# Patient Record
Sex: Female | Born: 1955 | Race: White | Marital: Single | Smoking: Never smoker
Health system: Northeastern US, Community
[De-identification: ages and names within clinical notes are randomized; demographics above are authoritative.]

## PROBLEM LIST (undated history)

## (undated) DIAGNOSIS — F32A Depression, unspecified: Secondary | ICD-10-CM

## (undated) DIAGNOSIS — I10 Essential (primary) hypertension: Secondary | ICD-10-CM

## (undated) DIAGNOSIS — E7800 Pure hypercholesterolemia, unspecified: Secondary | ICD-10-CM

## (undated) DIAGNOSIS — F329 Major depressive disorder, single episode, unspecified: Secondary | ICD-10-CM

## (undated) DIAGNOSIS — F419 Anxiety disorder, unspecified: Secondary | ICD-10-CM

## (undated) HISTORY — PX: ABDOMINAL HYSTERECTOMY: SHX81

## (undated) HISTORY — PX: BREAST SURGERY: SHX581

## (undated) HISTORY — PX: APPENDECTOMY: SHX54

## (undated) HISTORY — PX: KNEE CARTILAGE SURGERY: SHX688

## (undated) HISTORY — PX: RPR 1ST INGUN HRNA PRETERM INFT RDC: PRO125

## (undated) HISTORY — PX: VAGINAL HYSTERECTOMY UTERUS 250 GM/<: REP158

## (undated) HISTORY — PX: BUNIONECTOMY: SHX129

## (undated) HISTORY — DX: Depression, unspecified: F32.A

## (undated) HISTORY — DX: Major depressive disorder, single episode, unspecified: F32.9

## (undated) HISTORY — DX: Pure hypercholesterolemia, unspecified: E78.00

## (undated) HISTORY — PX: BREAST CYST EXCISION: SHX579

## (undated) HISTORY — DX: Essential (primary) hypertension: I10

## (undated) HISTORY — PX: BREAST BIOPSY: SHX20

## (undated) HISTORY — PX: APPENDECTOMY: GID334

## (undated) HISTORY — PX: BREAST MASS EXCISION: SHX1267

---
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
## 1998-03-26 HISTORY — PX: LASIK: SHX215

## 2017-10-22 LAB — PROTHROMBIN TIME CARE EVERYWHERE
INR CARE EVERYWHERE: 1.04 — NL (ref 0.84–1.18)
PROTHROMBIN TIME CARE EVERYWHERE: 11.8 s — NL (ref 9.5–13.4)

## 2017-10-22 LAB — APTT CARE EVERYWHERE: APTT CARE EVERYWHERE: 29.4 s — NL (ref 26.3–39.7)

## 2018-04-21 LAB — HEMOGLOBIN A1C CARE EVERYWHERE: HEMOGLOBIN A1C CARE EVERYWHERE: 5.4 % — NL (ref 4.8–5.6)

## 2019-08-27 ENCOUNTER — Other Ambulatory Visit: Payer: Self-pay

## 2019-08-28 DIAGNOSIS — S335XXA Sprain of ligaments of lumbar spine, initial encounter: Secondary | ICD-10-CM | POA: Diagnosis not present

## 2019-08-28 DIAGNOSIS — M9903 Segmental and somatic dysfunction of lumbar region: Secondary | ICD-10-CM | POA: Diagnosis not present

## 2019-08-28 DIAGNOSIS — M9901 Segmental and somatic dysfunction of cervical region: Secondary | ICD-10-CM | POA: Diagnosis not present

## 2019-08-28 DIAGNOSIS — S134XXA Sprain of ligaments of cervical spine, initial encounter: Secondary | ICD-10-CM | POA: Diagnosis not present

## 2019-10-05 DIAGNOSIS — M25561 Pain in right knee: Secondary | ICD-10-CM | POA: Diagnosis not present

## 2019-11-24 ENCOUNTER — Encounter (HOSPITAL_BASED_OUTPATIENT_CLINIC_OR_DEPARTMENT_OTHER): Payer: Self-pay | Admitting: Internal Medicine

## 2019-11-24 ENCOUNTER — Ambulatory Visit: Payer: 59 | Attending: Internal Medicine | Admitting: Internal Medicine

## 2019-11-24 DIAGNOSIS — F330 Major depressive disorder, recurrent, mild: Secondary | ICD-10-CM | POA: Insufficient documentation

## 2019-11-24 DIAGNOSIS — F419 Anxiety disorder, unspecified: Secondary | ICD-10-CM | POA: Insufficient documentation

## 2019-11-24 DIAGNOSIS — L608 Other nail disorders: Secondary | ICD-10-CM | POA: Insufficient documentation

## 2019-11-24 DIAGNOSIS — F411 Generalized anxiety disorder: Secondary | ICD-10-CM | POA: Insufficient documentation

## 2019-11-24 DIAGNOSIS — G4700 Insomnia, unspecified: Secondary | ICD-10-CM | POA: Insufficient documentation

## 2019-11-24 DIAGNOSIS — R0683 Snoring: Secondary | ICD-10-CM | POA: Insufficient documentation

## 2019-11-24 DIAGNOSIS — R030 Elevated blood-pressure reading, without diagnosis of hypertension: Secondary | ICD-10-CM

## 2019-11-24 MED ORDER — ESCITALOPRAM OXALATE 10 MG PO TABS
10.0000 mg | ORAL_TABLET | Freq: Every day | ORAL | 0 refills | Status: DC
Start: 2019-11-24 — End: 2019-12-18
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
## 2019-11-27 ENCOUNTER — Telehealth (HOSPITAL_BASED_OUTPATIENT_CLINIC_OR_DEPARTMENT_OTHER): Payer: Self-pay

## 2019-11-27 NOTE — Telephone Encounter (Signed)
[facility] to[facility] an appointmentfor [facility] exam. No interpreter needed.   Patient (self) answered at [phone]; appointment booked. Patient notified by telephone.    Denyce Robert, [place], 11/27/2019    On behalf of EOF:HQRFX Vassie Loll, MD

## 2019-12-03 ENCOUNTER — Telehealth (HOSPITAL_BASED_OUTPATIENT_CLINIC_OR_DEPARTMENT_OTHER): Payer: Self-pay | Admitting: Mental Health

## 2019-12-03 ENCOUNTER — Encounter (HOSPITAL_BASED_OUTPATIENT_CLINIC_OR_DEPARTMENT_OTHER): Payer: Self-pay | Admitting: Mental Health

## 2019-12-03 NOTE — Telephone Encounter (Signed)
Care Partner Note    Type of encounter:  Initial    Patient was referred to care partner through:  Warm hand-off from PCP or PA Dr. Cheron Every    Reason for encounter: MDD    Assessment:   Pt reached  Pt was doing well  Pt reported that has been having trouble finding therapist    This Clinical research associate:  Offered empathetic listening  Provided resources( a list of 4 providers that takes her insurance)  Sent through [employer] information on how to find a therapist    Denise Gutierrez, [facility], 12/03/19    Appears to be a Step 1 patient on the Stepped Model of Care    Patient Active Problem List:     Elevated BP without diagnosis of hypertension     Snoring     Toenail deformity     Insomnia     Major depressive disorder, recurrent, mild (HCC)     Anxiety disorder      Medication: escitalopram (LEXAPRO) 10 MG tablet, Take 1 tablet by mouth daily, Disp: 30 tablet, Rfl: 0    No current facility-administered medications on file prior to visit.      Relevant screening scores:   AWQ:   PHQ-9:   PHQ-9 TOTAL SCORE 11/24/2019   Doc FlowSheet Total Score 8     GAD-7:   GAD-7 Total 11/24/2019   GAD-7 Score 13      Child/Adolescent:    How difficult have these problems made it for you to do your work, take care of things at home, or get along with people?  Somewhat difficult     Engagement/motivation for change  (http://www.taylor-bailey.[email or web address] )  Contemplation    Primary brief Intervention provided during this encounter:  Provided Resources    PLAN:   Will connect with therapist  Check in iin 2 weeks    TIME SPENT: 10-20 minutes     Denise Gutierrez [facility] [place], 12/03/2019

## 2019-12-18 ENCOUNTER — Other Ambulatory Visit: Payer: Self-pay

## 2019-12-18 ENCOUNTER — Ambulatory Visit: Payer: 59 | Attending: Podiatrist | Admitting: Podiatrist

## 2019-12-18 ENCOUNTER — Ambulatory Visit
Admission: RE | Admit: 2019-12-18 | Discharge: 2019-12-18 | Disposition: A | Discharge status: Home / Self Care | Payer: 59 | Attending: Internal Medicine | Admitting: Internal Medicine

## 2019-12-18 ENCOUNTER — Encounter (HOSPITAL_BASED_OUTPATIENT_CLINIC_OR_DEPARTMENT_OTHER): Payer: Self-pay | Admitting: Internal Medicine

## 2019-12-18 VITALS — BP 160/82 | HR 75 | Temp 97.8°F

## 2019-12-18 DIAGNOSIS — B351 Tinea unguium: Secondary | ICD-10-CM | POA: Insufficient documentation

## 2019-12-18 DIAGNOSIS — Z5181 Encounter for therapeutic drug level monitoring: Secondary | ICD-10-CM | POA: Insufficient documentation

## 2019-12-18 DIAGNOSIS — F419 Anxiety disorder, unspecified: Secondary | ICD-10-CM

## 2019-12-18 DIAGNOSIS — F330 Major depressive disorder, recurrent, mild: Secondary | ICD-10-CM

## 2019-12-18 LAB — HEPATIC FUNCTION PANEL
ALANINE AMINOTRANSFERASE: 24 U/L (ref 12–45)
ALBUMIN: 3.9 g/dL (ref 3.4–5.0)
ALKALINE PHOSPHATASE: 154 U/L — ABNORMAL HIGH (ref 45–117)
ASPARTATE AMINOTRANSFERASE: 16 U/L (ref 8–34)
BILIRUBIN DIRECT: 0.1 mg/dl (ref 0.0–0.2)
BILIRUBIN TOTAL: 0.5 mg/dL (ref 0.2–1.0)
INDIRECT BILIRUBIN: 0.4 mg/dL (ref 0.2–0.9)
TOTAL PROTEIN: 7.3 g/dL (ref 6.4–8.2)

## 2019-12-18 MED ORDER — TERBINAFINE HCL 250 MG PO TABS
250.00 mg | ORAL_TABLET | Freq: Every day | ORAL | 2 refills | Status: AC
Start: 2019-12-18 — End: 2020-03-17

## 2019-12-18 NOTE — Procedures (Deleted)
Valentina Shaggy, MD - 10/05/2014  Formatting of this note might be different from the original.       [facility]   Adult Echocardiography Laboratory   [address] [place], [place] [address]   Tel: [phone] Fax: [phone]    2D Transthoracic Echo Report      --------------------------------------------------------------------------------  Pt. Name:   Denise Gutierrez     Study Date:     10/05/2014  Pt. ID:     [id number]        Accession #:    [id number]  DOB:        Dec 18, 1955      Ref. Physician: [id number] Glorianne Manchester  Age/Gender: 25 F           Sonographer:    MR  Height:     175 cm (69 in) Fellow:  Weight:     77 kg (169 lb) BSA:            1.92 m  Location:   [facility]      EPIC Code:      ECH10-E      --------------------------------------------------------------------------------  Indication: CP INPT      ---------------------------------------------- ----------------------------------  MEASUREMENTS:            Value    Normal                          Value      Normal  Aortic    3.5 cm   <4.1 cm            LVOT  Root                                  Diameter  LA        4.4 cm   <3.8 (<4.0)        LVOT Area  Diameter                              LVOT Stroke  LA Vol    32 ml/m <34                Volume  Index                                 LVOT Vmax    1.0 m/s  ---------                              (rest)                                        LVOT                                        dPmax(rest)  IV Septum 1.1 cm   <1.1 (<1.2)        LVOT Vmax  LVEDD     4.0 cm   <5.3 (<6.0)        (Vals)  Inf-Lat   1.1 cm   <1.1 (<1.2)  LVOT dPmax  Wall                                  (Vals)  LVESD     2.7 cm                      ------------  ---------                                          AV Vmax      1.2 m/s    1. 0-1.7  LVED Vol  44 ml/m <75                AV Peak  Index                                 Gradient  LV Mass   76 g/m  <95 (<115)         AV Mean      3 mmHg  Index                                  Gradient  ---------                             AV Area                 2-4                                        Impedance               <3.5                                        (Zva)  LVEF      65 %     50-70%             Aortic  ----- ----                             Regurge P                                        1/2                                        ------------  RAP, mean          0-5  PASP               <35  PADP               <15                MV E wave    0.7  m/s    0.6-1.3  RV-RA     19 mmHg                     Vmax  PA-RV                                 MV Mean  LA, mean           <12                Gradient  ---------                             MV Ar ea                 4-6                                        ------------    Mitral E  75 cm/s  Mitral A  86 cm/s                     TV E wave               0.3-0.7  Mitral    0.9                         Vmax  E/A                                   TV Mean  Decel     160 msec                    Gradient  Time                                  ------------  Mitral P  46 msec  1/2  E'        11 cm/s  >8                 RVOT  (medial)                               Diameter  E'        10 cm/s  >8                 RVOT Area  (lateral)                             RVOT Stroke  E/E'      7.1      <8                 Volume  PV S/D    >1                          RVOT Vmax  Normal values in parentheses are      ------------  specific for men; normal aortic root  values adjusted for age and BSA.  LAVI  data reflect 2015 ASE guidelines.     PV Vmax                 0.6-0.9                                         PV Peak                                        Gradient                                        ------------                                          BP           160/94                                                     mmHg                                        HR           60  bpm      --------------------------------------------------------------------------------  TECHNIQUE:  Complete 2D transthoracic ec hocardiogram with color and spectral Doppler was performed. Study   quality was good.      --------------------------------------------------------------------------------  FINDINGS:  Left Heart:  --There is no left atrial dilatation (LA volume index 32 ml/m).  --The left ventricle has normal end-diastolic diameter.  --LV analysis by 2D Simpson's method: LV end-diastolic volume index is normal (44 ml/m; normal   is less or equal to 75  mL/m2). LV end-systolic volume index is normal (14 ml/m; normal is   less or equal to 30 mL/m2).    --There is concentric remodeling of the left ventricle.  --Global LV wall motion is normal. LV ejection fraction is normal.  --The left ventricular filling pattern is consistent with abnormal relaxation. This may be   normal for age.  Mitral Valve:  --There is mild mitral regurgitation.  Aortic Valve:  --Aortic valve is trileaflet. Ther e is no aortic regurgitation.    Aorta:  --The aortic root is normal in size. Aortic arch is normal in size; its diameter is 2.5 cm.    Right Heart and Systemic Veins:  --There is no right atrial dilatation.  --The right ventricle is normal in size. Tricuspid annular plane systolic excursion (TAPSE), a   marker of RV systolic function, is normal (2.5 cm; normal greater than 1.5 cm). Peak tricuspid   valve annular tissue Doppler S wave v elocity is normal (14 cm/s; normal is equal or greater   than 10 cm/s). The right ventricle has normal wall motion.  Tricuspid Valve:  --There is trace tricuspid regurgitation.  Pericardium and Effusions: --There is no pericardial effusion.    --------------------------------------------------------------------------------  CONCLUSION:  --There is no left atrial dilatation (LA volume index 32 ml/m).  --The left ventricle has normal end -diastolic diameter.  --There is concentric  remodeling of the left ventricle.  --Global LV wall motion is normal.  --LV ejection fraction is normal.  --The left ventricular filling pattern is consistent with abnormal relaxation. This may be   normal for age.  --There is no right atrial dilatation. The right ventricle is normal in size. The right   ventricle has normal wall motion.  --The aortic root is normal in size (3.5 cm). Aortic ar ch is normal in size; its diameter is   2.5 cm.  --Aortic valve is trileaflet. There is no aortic regurgitation.  --There is mild mitral regurgitation.  --There is trace tricuspid regurgitation.  --There is no pericardial effusion.  --No prior study available for comparison.    Electronically released/read by: Valentina Shaggy, MD on 10/05/2014 at 11:00:57 AM         * Final *   IMPRESSION

## 2019-12-18 NOTE — Procedures (Deleted)
[name], MD - 12/22/2018  Formatting of this note might be different from the original.  Magnetic resonance imaging of the left forefoot 12/21/2018    Magnetic resonance imaging of the left forefoot was performed using axial inversion recovery, axial sagittal and coronal fast spin-echo sequences in addition to metal artifact reduction [facility]) inversion recovery sequence.    HISTORY: Patient fell 2 months ago. Pain.    Correlat ion is made with radiographs 12/09/2018    Susceptibility artifact is generated by instrumentation transfixing Lapidus arthrodesis and 1st proximal phalangeal osteotomy.    Of note, patient has sustained a, fairly acute, obliquely oriented fracture of the distal diaphysis of the 3rd metatarsal with overriding and bayoneting of the fracture fragments. There is moderate reactive edema throughout the shaft of the metatarsal with surroundin g soft tissue periostitis and intramuscular hyperintensity. There is resultant foreshortening of the 3rd ray.    No other fractures are identified    Signal and morphology of the hallux sesamoids is within normal.    There is intrinsic muscle atrophy    Patient has apparently undergone medial eminence resection. There are fairly moderate arthritic changes in the 1st MTP and sesamotrochlear articulations with chronic fragmentation of the  hallux sesamoids.     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    IMPRESSION:    Magnetic resonance imaging of the left forefoot demonstrates acute mid shaft 3rd metatarsal fracture with bayoneting of the fracture fragments and resultant foreshortening of the 3rd ray with reactive edema, surrounding soft tissue periostitis and intramuscular hyperintensity    No other acute osseous abnormalities ar e seen    Postoperative changes are present in the hallux in satisfactory alignment.    Report faxed to Dr. Henriette Combs office 9:00 AM 12/22/2018    Final Report: Dictated by  and  Personally Viewed and Signed by Arther Dames MD. Released Date and Time: 12/22/2018 8:03 AM.

## 2019-12-18 NOTE — Procedures (Deleted)
Bernarda Caffey, MD - 10/04/2014  Formatting of this note might be different from the original.  Chest PA and lateral    History: Short of breath    Technique: PA and Lateral views of the chest were obtained.    Comparison: None    Findings:     Heart size within normal limits. Tortuous aorta.    Airways inflammation. No consolidation, edema, significant pleural fluid or discernible pneumothorax.    Mild degenerative changes of the spine . No acute osseous finding.    Impression:    No radiographic evidence for active cardiopulmonary disease.    Final Report: Dictated by   and Signed by Attending Cristino Martes MD 10/04/2014 7:45 PM

## 2019-12-18 NOTE — Procedures (Deleted)
Johnston Ebbs, MD - 10/05/2014  Formatting of this note might be different from the original.  [facility]      [address] [place], [place] [address]         TEL:[phone]     Lower Extremity Venous Duplex      Pt. Name: Denise Gutierrez     Exam Date:   10/05/2014  Pt. ID:   [id number]       Pt Location  [facility]  DOB:      August 26, 1955      Sonographer: Carlynn Purl  Sex:      F Age:64 years Diagnosis    Deep Vein Th romb of lower extrem    Referring Physician: [id number] Lanice Shirts SHEPPARD    Symptoms:  History:  SOB     TECHNICAL IMAGING & DOPPLER FINDINGS    Right Lower Extremity:    There is no evidence of acute deep vein thrombosis or deep reflux in the right lower extremity.   All major veins are patent and compressible. The right posterior tibial and peroneal veins are   normal.      Left Lower Extremity:    There is no evidence of acute deep  vein thrombosis or deep reflux in the left lower extremity.   All major veins are patent and compressible. The left posterior tibial and peroneal veins are   normal.      Electronically Signed by: [id number] Gasper Lloyd MD 10/05/2014 1:54:37 PM  Signature on file         * Final *   IMPRESSION

## 2019-12-18 NOTE — Procedures (Deleted)
Denise Noss, MD - 11/26/2017  Formatting of this note might be different from the original.  AP lateral oblique left foot 11/26/2017    HISTORY: Postop    Comparison is made to prior study 09/04/2017    Patient is undergone interval first TMT arthrodesis with first proximal phalangeal osteotomy. Alignment of the hallux is satisfactory. Negative for fracture. There is mild soft tissue swelling. There are enthesopathic changes at the  Achilles insertion as well as at the base of the fifth metatarsal    IMPRESSION:  Postoperative changes with overall improved alignment of the hallux.

## 2019-12-18 NOTE — Procedures (Deleted)
Lanelle Bal, MD - 12/11/2018  Formatting of this note might be different from the original.  EXAM: XR FOOT AND ANKLE LEFT AP, LATERAL, OBLIQUE    COMPARISON: 04/29/2018    INDICATION: Postoperative    IMPRESSION:    Again, the patient is status post Lapidus arthrodesis with medial eminence resection and first proximal phalangeal osteotomy. No interval complication and the alignment is unchanged. Bone graft harvest site is redemonst rated at the calcaneus.     Calcaneal spurring is redemonstrated and there are enthesopathic changes at the base of the fifth metatarsal, unchanged.    Ankle mortise is congruent with preservation of tibiotalar joint space and contour of the talar dome.    Final Report: Dictated by  and Personally Viewed and Signed by Lethea Killings MD. Released Date and Time: 12/11/2018 6:03 PM.

## 2019-12-18 NOTE — Procedures (Deleted)
[name], MD - 04/29/2018  Formatting of this note might be different from the original.  AP lateral oblique left foot 04/29/2018    HISTORY: Postop    Comparison made to prior study 01/22/2018    Patient is status post Lapidus arthrodesis with medial eminence resection and 1st proximal phalangeal osteotomy. Alignment of the hallux is unchanged. Negative for fracture. There is soft tissue swelling. Bone graft has apparently been  harvested from the calcaneus. There are chronic enthesopathic changes at the base of the 5th metatarsal    IMPRESSION:  Postoperative changes without acute osseous abnormality or change in alignment    Final Report: Dictated by  and Personally Viewed and Signed by Dr. Arther Dames. Released Date and Time: 04/29/2018 4:44 PM.

## 2019-12-18 NOTE — Procedures (Deleted)
Nydia Bouton, MD - 10/05/2014  Formatting of this note might be different from the original.  [facility]           Nuclear Cardiology Lab          [address], [address]             [place], [place] [address]   TEL: [phone] FAX: [phone]      --------------------------------------------------------------------------------  Treadmill Stress Test      -------------------------------------------------------------- ------------------  Patient Name: Denise Gutierrez Study Date: 10/05/2014  DOB:          1956/03/20  Weight:     168 lb  MR#:          [id number]   Height:     69 in  Accession:    [id number]   BMI:        24.81 kg/m  Gender:       F          Epic Code:  [id number]  Age:          63 years   Location:   [facility] 2nd Floor      Ref Physician: Yevette Edwards.                        Interviewed by: Jackelyn Hoehn  Supervising:   Nydia Bouton                          Performed by:   Jackelyn Hoehn  Fellow:        Dimple Casey and Temple Pacini,                 NP      --------------------------------------------------------------------------------  Indication: Cardiovascular Disease, Unspecified    --------------------------------------------------------------------------------  Clinical History:  Reason For Test:     Evaluation of chest pain.  Risk Factors:        Post menopausal.  Pre-te st Chest Pain: Atypical Angina.  Cardiac History:     none  Known CAD:           No  Other History:       None.  Medications:         none  Allergies:           IV contrast.      ==============================================================================  IMPRESSION: Normal study.    SUMMARY:   1. No ECG evidence of ischemia during maximal exercise stress test. No chest pain on exertion.   Exercise tolerance above average for age and  gender.    --------------------------------------------------------------------------------  Electrocardiographic and Hemodynamic  Data      --------------------------------------------------------------------------------  Baseline ECG: NSR at 69.    Stress ECG Response: No ECG abnormalities for ischemia.    Ectopic beats: Occasional premature ventricular contractions.      The patient performed treadmill exercise under the [facility], completing 9 minutes 11   seconds, and achieved an estimated workload of 10 metabolic equivalents (METS). Good level of   exercise.    Bruce Protocol:  +--------+----------+--------+-------+------+--------+  Time    Speed(mph)Grade(%)HR(bpm)  BP  O2 Sat %  +--------+----------+--------+-------+------+--------+  Baseline Standing   N/A     69   142/92  100     +--------+----------+--------+-------+------+--------+  3:00       1.7       10%     106  148/98          +--------+----------+--------+-------+------+--------+  6:00       2.5      12%     133  162/92          +--------+----------+--------+-------+------+--------+  9:11       3.4      14%     157  172/90   99     +--------+----------+--------+-------+------+--------+  Recovery:  +----+--------++---+-------+---+  1:00Recovery127178/101100  +----+--------++---+-------+---+  2:00Recovery100            +----+---- ----++---+-------+---+  3:00Recovery100155/91      +----+--------++---+-------+---+  4:00Recovery91             +----+--------++---+-------+---+  5:00Recovery85 137/89      +----+--------++---+-------+---+      Exercise Stress Test Data:  Bruce Protocol                85% of Max Predicted HR: 137 bpm  Baseline HR: 69 bpm           Peak HR: 157 bpm  HR Recovery: 30 bpm           HR Reserve: 92 bpm  Baseline BP: 142/92 mmHg      Peak BP:  178/101 mmHg  Baseline RPP: [id number]            Peak RPP: [id number]  Exercise Duration: 11sec  Exercise Terminated Due To:   Fatigue.      Symptoms / Exercise Impression:  Symptoms:    No chest pain.  HR Response: Calculated patient's HR reserve indicates a normal chronotropic               response (normal is greater  than 80%). This is an appropriate               response.  BP Response: Resting hypertension.  Performance: Good exercise c apacity for age and gender.      Electronically Signed by: Nydia Bouton, MD., on 10/05/2014 at 5:08:33 PM         * Final *   IMPRESSION
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
## 2019-12-18 NOTE — Procedures (Deleted)
[name], MD - 01/23/2018  Formatting of this note might be different from the original.  AP lateral oblique left foot 01/22/2018    HISTORY: Follow-up foot surgery    Comparison is made to prior study 12/25/2017    Instrumentation is present transfixing the first TMT joint. Patient has also undergone bunionectomy and first proximal phalangeal osteotomy. Alignment and appearance of the hardware is unchanged. Negative for fractur e    IMPRESSION:  Postoperative changes status post hallux valgus correction without acute osseous abnormality or change in alignment

## 2019-12-18 NOTE — Procedures (Deleted)
Madison Hickman, MD - 10/05/2014  Formatting of this note might be different from the original.  NM LUNG SCAN PERFUSION VENTILATION    Agent:    4.0 mCi 52mTc-MAA, right antecubital IV.  42.0 mCi 38mTc-DTPA, aerosolized.    History:    64 year old woman with acute chest pain concerning for acute PE.  This study was performed to look for evidence of pulmonary embolism.  Slightly elevated D dimer.    Comparison:    CXR 10/04/2014.    Tech nique:    35mTc-DTPA aerosol was administered per protocol and planar images of the lungs were acquired in the usual 8 anterior, lateral and oblique orientations.    104mTc-MAA was then administered and planar perfusion images of the lungs were acquired in the same 8 orientations.    Findings:    There are no significant segmental or lobar mismatched perfusion defects identified.  Slightly patchy matched decreased ventilation and perfusi on is noted in the anterior LUL; this could represent mild parenchymal lung disease or perhaps attenuation artifact.    Impression:    1.  Very low probability for pulmonary embolism.    Final Report: Dictated by Resident Sherrin Daisy MD  and Signed by Attending Lurlean Leyden MD 10/05/2014 2:54 PM

## 2019-12-18 NOTE — Procedures (Deleted)
[name], MD - 12/30/2017  Formatting of this note might be different from the original.  AP lateral oblique left foot 12/25/2017    HISTORY: Postop    Comparison made to prior study 11/26/2017    Patient is status post Lapidus arthrodesis with bunionectomy and first proximal phalangeal osteotomy. Alignment and appearance of the hardware is grossly unchanged. Negative for fracture. There are chronic enthesopathic changes at the  base of the fifth metatarsal.    IMPRESSION:  Postoperative changes without acute osseous abnormality or change in alignment.

## 2019-12-18 NOTE — Procedures (Deleted)
[name], MD - 09/04/2017  Formatting of this note might be different from the original.  AP lateral oblique bilateral feet 09/04/2017    HISTORY: Bilateral foot pain    On the right, there is hallux valgus with bunion formation. There are hammertoe deformities. Negative for fracture. There is ossification at the Achilles insertion    On the left, patient is apparently status post bunionectomy and questionably Lapidus arthrodesis . There are chronic changes centered about the first TMT joint with partial, though incomplete osseous bridging. There are hammertoe deformities. There is mild arthrosis at the first MTP joint. There are chronic enthesopathic changes at the base the fifth metatarsal    IMPRESSION:    Bunion formation on the right with postoperative changes on the left with apparent incomplete osseous bridging across attempted Lapidus arthrodesis without  acute osseous abnormality.

## 2019-12-18 NOTE — Procedures (Deleted)
Systemgenerated, Documentation - 11/07/2017  Formatting of this note might be different from the original.  These images were obtained during an operative procedure and are included in [facility] information systems for archiving purposes only. Information and documentation of the procedure(s) performed should be included in the operative report. Radiology provided the technical component only

## 2019-12-19 NOTE — Telephone Encounter (Signed)
PER Pharmacy, Arieonna Medine is a 64 year old female has requested a refill of    LEXAPRO       Last Office Visit: 11/24/2019   With [facility], J   Last Physical Exam:N/A    PAP SMEAR Never done  HPV SCREENING Never done  FECAL OCCULT BLOOD AGE 11+ Never done    Other Med Adult:  Most Recent BP Reading(s)  12/18/19 : 160/82        No results found for: CHOLESTEROL  No results found for: LDL  No results found for: HDL  No results found for: TG      No results found for: TSHSC      No results found for: TSH    No results found for: HGBA1C    No results found for: POCA1C      INR CARE EVERYWHERE (no units)   Date Value   10/22/2017 1.04       No results found for: NA    No results found for: K        No results found for: CREAT    Documented patient preferred pharmacies:    [id number] [address], [place] - [address]  Phone: [phone] Fax: [phone]

## 2019-12-20 MED ORDER — ESCITALOPRAM OXALATE 10 MG PO TABS
10.0000 mg | ORAL_TABLET | Freq: Every day | ORAL | 0 refills | Status: DC
Start: 2019-12-20 — End: 2020-03-29

## 2019-12-21 ENCOUNTER — Telehealth (HOSPITAL_BASED_OUTPATIENT_CLINIC_OR_DEPARTMENT_OTHER): Payer: Self-pay | Admitting: Podiatrist

## 2019-12-21 NOTE — Telephone Encounter (Signed)
Spoke with the patient and reviewed her lab results. Alk phos is elevated. She has no history of gall stones or indigestion issues- will defer to pcp  Whether further work up warranted. For her terbinafine explained we will do pulse dosing - take daily for one week every month.

## 2020-01-05 ENCOUNTER — Encounter (HOSPITAL_BASED_OUTPATIENT_CLINIC_OR_DEPARTMENT_OTHER): Payer: Self-pay | Admitting: Internal Medicine

## 2020-01-05 ENCOUNTER — Ambulatory Visit: Payer: 59 | Attending: Internal Medicine | Admitting: Internal Medicine

## 2020-01-05 ENCOUNTER — Telehealth (HOSPITAL_BASED_OUTPATIENT_CLINIC_OR_DEPARTMENT_OTHER): Payer: Self-pay | Admitting: Internal Medicine

## 2020-01-05 ENCOUNTER — Other Ambulatory Visit: Payer: Self-pay

## 2020-01-05 VITALS — BP 130/84 | HR 73 | Ht 67.5 in | Wt 166.0 lb

## 2020-01-05 DIAGNOSIS — R030 Elevated blood-pressure reading, without diagnosis of hypertension: Secondary | ICD-10-CM | POA: Diagnosis present

## 2020-01-05 DIAGNOSIS — G4700 Insomnia, unspecified: Secondary | ICD-10-CM | POA: Diagnosis not present

## 2020-01-05 DIAGNOSIS — R0683 Snoring: Secondary | ICD-10-CM | POA: Diagnosis not present

## 2020-01-05 DIAGNOSIS — Z0000 Encounter for general adult medical examination without abnormal findings: Secondary | ICD-10-CM | POA: Diagnosis present

## 2020-01-05 DIAGNOSIS — Z23 Encounter for immunization: Secondary | ICD-10-CM | POA: Diagnosis not present

## 2020-01-05 DIAGNOSIS — Z7189 Other specified counseling: Secondary | ICD-10-CM | POA: Insufficient documentation

## 2020-01-05 DIAGNOSIS — Z1231 Encounter for screening mammogram for malignant neoplasm of breast: Secondary | ICD-10-CM | POA: Diagnosis present

## 2020-01-05 DIAGNOSIS — F419 Anxiety disorder, unspecified: Secondary | ICD-10-CM | POA: Diagnosis not present

## 2020-01-05 DIAGNOSIS — E7849 Other hyperlipidemia: Secondary | ICD-10-CM | POA: Insufficient documentation

## 2020-01-05 DIAGNOSIS — E785 Hyperlipidemia, unspecified: Secondary | ICD-10-CM | POA: Diagnosis present

## 2020-01-05 DIAGNOSIS — F330 Major depressive disorder, recurrent, mild: Secondary | ICD-10-CM | POA: Diagnosis present

## 2020-01-05 LAB — CBC, PLATELET & DIFFERENTIAL
ABSOLUTE BASO COUNT: 0.1 10*3/uL (ref 0.0–0.1)
ABSOLUTE EOSINOPHIL COUNT: 0.2 10*3/uL (ref 0.0–0.8)
ABSOLUTE IMM GRAN COUNT: 0.01 10*3/uL (ref 0.00–0.03)
ABSOLUTE LYMPH COUNT: 1.9 10*3/uL (ref 0.6–5.9)
ABSOLUTE MONO COUNT: 0.4 10*3/uL (ref 0.2–1.4)
ABSOLUTE NEUTROPHIL COUNT: 4.1 10*3/uL (ref 1.6–8.3)
ABSOLUTE NRBC COUNT: 0 10*3/uL (ref 0.0–0.0)
BASOPHIL %: 1.2 % (ref 0.0–1.2)
EOSINOPHIL %: 2.5 % (ref 0.0–7.0)
HEMATOCRIT: 45.6 % — ABNORMAL HIGH (ref 34.1–44.9)
HEMOGLOBIN: 14.5 g/dL (ref 11.2–15.7)
IMMATURE GRANULOCYTE %: 0.1 % (ref 0.0–0.4)
LYMPHOCYTE %: 28.2 % (ref 15.0–54.0)
MEAN CORP HGB CONC: 31.8 g/dL (ref 31.0–37.0)
MEAN CORPUSCULAR HGB: 27.6 pg (ref 26.0–34.0)
MEAN CORPUSCULAR VOL: 86.7 fl (ref 80.0–100.0)
MEAN PLATELET VOLUME: 11.5 fL (ref 8.7–12.5)
MONOCYTE %: 6.6 % (ref 4.0–13.0)
NEUTROPHIL %: 61.4 % (ref 40.0–75.0)
NRBC %: 0 % (ref 0.0–0.0)
PLATELET COUNT: 315 10*3/uL (ref 150–400)
RBC DISTRIBUTION WIDTH STD DEV: 44.6 fL (ref 35.1–46.3)
RED BLOOD CELL COUNT: 5.26 M/uL — ABNORMAL HIGH (ref 3.90–5.20)
WHITE BLOOD CELL COUNT: 6.7 10*3/uL (ref 4.0–11.0)
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
## 2020-01-05 NOTE — Telephone Encounter (Signed)
tadp    The vaccine is covered under the patient's [employer] medical coverage.    Please choose Private

## 2020-01-06 LAB — COMP METABOLIC PANEL, FASTING
ALANINE AMINOTRANSFERASE: 26 U/L (ref 12–45)
ALBUMIN: 3.8 g/dL (ref 3.4–5.0)
ALKALINE PHOSPHATASE: 144 U/L — ABNORMAL HIGH (ref 45–117)
ANION GAP: 4 mmol/L — ABNORMAL LOW (ref 5–15)
ASPARTATE AMINOTRANSFERASE: 16 U/L (ref 8–34)
BILIRUBIN TOTAL: 0.4 mg/dL (ref 0.2–1.0)
BUN (UREA NITROGEN): 22 mg/dL — ABNORMAL HIGH (ref 7–18)
CALCIUM: 9.2 mg/dL (ref 8.5–10.1)
CARBON DIOXIDE: 28 mmol/L (ref 21–32)
CHLORIDE: 108 mmol/L — ABNORMAL HIGH (ref 98–107)
CREATININE: 0.7 mg/dL (ref 0.4–1.2)
ESTIMATED GLOMERULAR FILT RATE: >60 mL/min (ref >60)
GLUCOSE FASTING: 80 mg/dL (ref 74–106)
POTASSIUM: 4.3 mmol/L (ref 3.5–5.1)
SODIUM: 140 mmol/L (ref 136–145)
TOTAL PROTEIN: 7 g/dL (ref 6.4–8.2)

## 2020-01-06 LAB — HIV ANTIGEN ANTIBODY 5TH GEN: HIVAGAB QUALITATIVE: NONREACTIVE

## 2020-01-06 LAB — LIPID PANEL
Cholesterol: 235 mg/dL (ref 0–239)
HIGH DENSITY LIPOPROTEIN: 63 mg/dL (ref 40–?)
LOW DENSITY LIPOPROTEIN DIRECT: 151 mg/dL (ref 0–189)
TRIGLYCERIDES: 112 mg/dL (ref 0–150)

## 2020-01-06 LAB — HEPATITIS B SURFACE AB QUANT: HEPATITIS B SURFACE AB QUANT: <3.1 m[IU]/mL (ref 0.00–9.99)

## 2020-01-06 LAB — HEMOGLOBIN A1C
ESTIMATED AVERAGE GLUCOSE: 105 mg/dL (ref 74–160)
HEMOGLOBIN A1C: 5.3 % (ref 4.0–5.6)

## 2020-01-06 LAB — HEPATITIS B SURFACE ANTIGEN: HEPATITIS B SURFACE ANTIGEN: NONREACTIVE

## 2020-01-06 LAB — VITAMIN D,25 HYDROXY: VITAMIN D,25 HYDROXY: 29 ng/mL — ABNORMAL LOW (ref 30.0–100.0)

## 2020-01-06 LAB — THYROID SCREEN TSH REFLEX FT4: THYROID SCREEN TSH REFLEX FT4: 2.1 u[IU]/mL (ref 0.358–3.740)

## 2020-01-06 LAB — GAMMA GLUTAMYL TRANSFERASE: GAMMA GLUTAMYL TRANSFERASE: 20 IU/L (ref 5–85)

## 2020-01-06 LAB — HEPATITIS C PCR QUAL TO QUANT: HEPATITIS C ANTIBODY: NONREACTIVE

## 2020-01-06 LAB — CMP (EXT)
ALT/SGPT (EXT): 26 U/L (ref 12–45)
AST/SGOT (EXT): 16 U/L (ref 8–34)
Albumin (EXT): 3.8 g/dL (ref 3.4–5.0)
Alkaline Phosphatase (EXT): 144 U/L — ABNORMAL HIGH (ref 45–117)
Anion Gap (EXT): 4 mmol/L — ABNORMAL LOW (ref 5–15)
BUN (EXT): 22 mg/dL — ABNORMAL HIGH (ref 7–18)
Bilirubin, Total (EXT): 0.4 mg/dL (ref 0.2–1.0)
CO2 (EXT): 28 mmol/L (ref 21–32)
CalciumCalcium (EXT): 9.2 mg/dL (ref 8.5–10.1)
Chloride (EXT): 108 mmol/L — ABNORMAL HIGH (ref 98–107)
Creatinine (EXT): 0.7 mg/dL (ref 0.4–1.2)
Glucose (EXT): 80 mg/dL (ref 74–106)
Potassium (EXT): 4.3 mmol/L (ref 3.5–5.1)
Protein (EXT): 7 g/dL (ref 6.4–8.2)
Sodium (EXT): 140 mmol/L (ref 136–145)
eGFR - Creat CKD-EPI (EXT): >60 mL/min (ref >60)

## 2020-01-06 LAB — LIPID PROFILE (EXT)
Cholesterol (EXT): 235 mg/dL (ref 0–239)
HDL Cholesterol (EXT): 63 mg/dL (ref 40–?)
LDL Cholesterol (EXT): 151 mg/dL (ref 0–189)
Triglycerides (EXT): 112 mg/dL (ref 0–150)

## 2020-01-06 LAB — UNMAPPED LAB RESULTS: HEPATITIS C ANTIBODY (EXT): NONREACTIVE

## 2020-01-20 ENCOUNTER — Ambulatory Visit
Admission: RE | Admit: 2020-01-20 | Discharge: 2020-01-20 | Disposition: A | Discharge status: Home / Self Care | Payer: 59 | Attending: Internal Medicine | Admitting: Internal Medicine

## 2020-01-20 ENCOUNTER — Encounter (HOSPITAL_BASED_OUTPATIENT_CLINIC_OR_DEPARTMENT_OTHER): Payer: Self-pay

## 2020-01-20 ENCOUNTER — Other Ambulatory Visit: Payer: Self-pay

## 2020-01-20 DIAGNOSIS — Z1231 Encounter for screening mammogram for malignant neoplasm of breast: Secondary | ICD-10-CM | POA: Diagnosis present

## 2020-01-21 ENCOUNTER — Other Ambulatory Visit (HOSPITAL_BASED_OUTPATIENT_CLINIC_OR_DEPARTMENT_OTHER): Payer: Self-pay | Admitting: Internal Medicine

## 2020-01-21 DIAGNOSIS — F419 Anxiety disorder, unspecified: Secondary | ICD-10-CM

## 2020-01-21 DIAGNOSIS — F330 Major depressive disorder, recurrent, mild: Secondary | ICD-10-CM

## 2020-01-25 ENCOUNTER — Encounter (HOSPITAL_BASED_OUTPATIENT_CLINIC_OR_DEPARTMENT_OTHER): Payer: Self-pay

## 2020-01-29 ENCOUNTER — Ambulatory Visit: Payer: 59 | Attending: Podiatrist | Admitting: Podiatrist

## 2020-01-29 ENCOUNTER — Other Ambulatory Visit: Payer: Self-pay

## 2020-01-29 DIAGNOSIS — Z5181 Encounter for therapeutic drug level monitoring: Secondary | ICD-10-CM | POA: Insufficient documentation

## 2020-01-29 DIAGNOSIS — B351 Tinea unguium: Secondary | ICD-10-CM

## 2020-01-29 NOTE — Progress Notes (Signed)
HPI: Denise Gutierrez is a 64 year old who presents for follow-up evaluation regarding her onychomycosis.  She is doing a pulse dosing regimen of terbinafine.  She thinks she is seeing some improvement though it is slow.  No side effects from medication.  Repeat labs were performed by her primary care physician since I saw her last and alk phosphatase remains stable and slightly decreased.  Unclear why this is elevated.  No other pedal concerns today.  She is interested in taking the medication daily to expedite the process.    Review of systems: No fever chills nausea vomiting    Physical exam:  General: Pleasant, no apparent stress  Lower extremities: Pedal pulses are palpable capillary fill time is within normal limits.  All 10 digit nails with some slight white discoloration distally and appear to be improved from previous needed to have picture.  They are not completely resolved yet.  No new lesions.    Assessment: Onychomycosis    Plan: Performed lower extremity evaluation and brief medical review.  Reviewed LFTs in her chart as well which the last were obtained on 1012.  At this time, she would like to move forward with daily oral treatments.  I think that at this point, it is safe to do so.  Educated her to call me if she has any concerns otherwise she is to follow-up with me in 2 months for reevaluation when she has completed the regimen of the antifungal.    A majority of this note has been dictated with a voice recognition system. Occasional wrong-word or "sound-A-like" substitutions may have occurred due to the inherent limitations of voice recognition software. Read the chart carefully and recognize, using context, where substitutions have occurred. Please excuse any identified errors in spelling or syntax. Every effort has been made to appropriately edit and correct upon completion.

## 2020-02-02 ENCOUNTER — Ambulatory Visit
Admission: RE | Admit: 2020-02-02 | Discharge: 2020-02-02 | Disposition: A | Discharge status: Home / Self Care | Payer: 59 | Attending: Internal Medicine | Admitting: Internal Medicine

## 2020-02-02 ENCOUNTER — Other Ambulatory Visit (HOSPITAL_BASED_OUTPATIENT_CLINIC_OR_DEPARTMENT_OTHER): Payer: Self-pay | Admitting: Internal Medicine

## 2020-02-02 DIAGNOSIS — Z719 Counseling, unspecified: Secondary | ICD-10-CM

## 2020-03-15 ENCOUNTER — Ambulatory Visit: Payer: 59 | Attending: Psychosomatic Medicine | Admitting: Psychologist

## 2020-03-15 DIAGNOSIS — F330 Major depressive disorder, recurrent, mild: Secondary | ICD-10-CM | POA: Diagnosis not present

## 2020-03-15 DIAGNOSIS — F419 Anxiety disorder, unspecified: Secondary | ICD-10-CM
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
## 2020-03-29 ENCOUNTER — Encounter (HOSPITAL_BASED_OUTPATIENT_CLINIC_OR_DEPARTMENT_OTHER): Payer: Self-pay | Admitting: Internal Medicine

## 2020-03-29 DIAGNOSIS — F330 Major depressive disorder, recurrent, mild: Secondary | ICD-10-CM

## 2020-03-29 DIAGNOSIS — F419 Anxiety disorder, unspecified: Secondary | ICD-10-CM

## 2020-03-29 MED ORDER — ESCITALOPRAM OXALATE 20 MG PO TABS
20.0000 mg | ORAL_TABLET | Freq: Every day | ORAL | 1 refills | Status: DC
Start: 2020-03-29 — End: 2020-06-02

## 2020-04-01 ENCOUNTER — Ambulatory Visit
Admission: RE | Admit: 2020-04-01 | Discharge: 2020-04-01 | Disposition: A | Discharge status: Home / Self Care | Payer: 59 | Attending: Podiatrist | Admitting: Podiatrist

## 2020-04-01 ENCOUNTER — Ambulatory Visit: Payer: 59 | Attending: Podiatrist | Admitting: Podiatrist

## 2020-04-01 ENCOUNTER — Other Ambulatory Visit: Payer: Self-pay

## 2020-04-01 DIAGNOSIS — Z5181 Encounter for therapeutic drug level monitoring: Secondary | ICD-10-CM | POA: Insufficient documentation

## 2020-04-01 DIAGNOSIS — B351 Tinea unguium: Secondary | ICD-10-CM | POA: Diagnosis not present

## 2020-04-01 LAB — HEPATIC FUNCTION PANEL
ALANINE AMINOTRANSFERASE: 35 U/L (ref 12–45)
ALBUMIN: 3.6 g/dL (ref 3.4–5.0)
ALKALINE PHOSPHATASE: 131 U/L — ABNORMAL HIGH (ref 45–117)
ASPARTATE AMINOTRANSFERASE: 19 U/L (ref 8–34)
BILIRUBIN DIRECT: <0.1 mg/dl (ref 0.0–0.2)
BILIRUBIN TOTAL: 0.2 mg/dL (ref 0.2–1.0)
TOTAL PROTEIN: 6.9 g/dL (ref 6.4–8.2)

## 2020-04-01 NOTE — Progress Notes (Signed)
CC: Fungal toenails    HPI  Denise Gutierrez is a 65 year old  female who presents to clinic for continued evaluation of fungal toenail bilaterally.  She has been followed since late September and has undergone pulsed dosing regimen for terbinafine.  Last month she started on daily doses given minimal improvement.  Does not relate any side effects of the medication.  She believes that there is very little improvement however does understand that this process is slow.  Otherwise no other pedal complaints.  Denies any constitutional symptoms.      ROS: Per HPI above, all systems were reviewed and are negative.     OBJECTIVE:  General: AAOx3. NAD. Pleasant. Non toxic appearance.   Vascular: DP and PT pulses palpable, b/l. Capillary fill time brisk to distal digits. No increase in skin temperature gradient. No pedal edema noted.   Neurologic: Gross sensation intact to foot and ankle b/l.  Dermatologic: See media tab for updated pictures.  All toenails with some white discoloration distally which appears to have improved from previous picture seen from 11/2019.  Hallux, second digit nails are mostly affected and not fully resolved.  Otherwise skin is intact with no lesions.  Orthopedic: Moving all digits.  No areas of pain noted to the foot or ankle bilaterally.  Ambulates independently in conventional shoe gears.    LABORATORY VALUES:  -Hepatic functions (04/06/2020) : Pending    ASSESSMENT:  Onychomycosis    PLAN:  Patient was seen and examined today.  She reports now being approximately 1 month since starting daily oral terbinafine treatments.Compared to previous pictures taken there is a notable improvement in the nail appearance.  We will obtain updated LFTs today and if these results are unremarkable she may continue with current treatment.  We will reevaluate her in approximately 3 months.    - RTC 3 months        Grace Isaac PGY 2

## 2020-04-02 NOTE — Progress Notes (Signed)
I interviewed, examined, and evaluated the patient today.   I discussed clinical and diagnostic findings, as well as care plans pertinent to this patient with the resident.  I reviewed the resident note and agree with the documentation and have the following additions and/or clarification and/or corrections:             CC: Fungal toenails    HPI  Denise Gutierrez is a 64 year old  female who presents to clinic for continued evaluation of fungal toenail bilaterally.  She has been followed since late September and has undergone pulsed dosing regimen for terbinafine.  Last month she started on daily doses given minimal improvement.  Does not relate any side effects of the medication.  She believes that there is very little improvement however does understand that this process is slow.  Otherwise no other pedal complaints.  Denies any constitutional symptoms.       ROS: Per HPI above, all systems were reviewed and are negative.     OBJECTIVE:  General: AAOx3. NAD. Pleasant. Non toxic appearance.   Vascular: DP and PT pulses palpable, b/l. Capillary fill time brisk to distal digits. No increase in skin temperature gradient. No pedal edema noted.   Neurologic: Gross sensation intact to foot and ankle b/l.  Dermatologic: See media tab for updated pictures.  All toenails with some white discoloration distally which appears to have improved from previous picture seen from 11/2019.  Hallux, second digit nails are mostly affected and not fully resolved.  Otherwise skin is intact with no lesions.  Orthopedic: Moving all digits.  No areas of pain noted to the foot or ankle bilaterally.  Ambulates independently in conventional shoe gears.    LABORATORY VALUES:  -Hepatic functions (04/06/2020) : Pending    ASSESSMENT:  Onychomycosis    PLAN:  Patient was seen and examined today.  She reports now being approximately 1 month since starting daily oral terbinafine treatments.Compared to previous pictures taken there is a notable  improvement in the nail appearance.  We will obtain updated LFTs today and if these results are unremarkable she may continue with current treatment.  We will reevaluate her in approximately 3 months.    - RTC 3 months        Grace Isaac PGY 2

## 2020-04-05 ENCOUNTER — Encounter (HOSPITAL_BASED_OUTPATIENT_CLINIC_OR_DEPARTMENT_OTHER): Payer: Self-pay

## 2020-04-05 ENCOUNTER — Ambulatory Visit: Payer: 59 | Attending: Psychosomatic Medicine | Admitting: Psychologist

## 2020-04-05 DIAGNOSIS — F411 Generalized anxiety disorder: Secondary | ICD-10-CM

## 2020-04-05 DIAGNOSIS — F330 Major depressive disorder, recurrent, mild: Secondary | ICD-10-CM | POA: Diagnosis not present

## 2020-04-05 NOTE — Progress Notes (Signed)
ADULT OUTPATIENT PSYCHIATRY (OPD and [facility])  THERAPY FOLLOW UP     Session 2    CHIEF COMPLAINT: "I'd like to find things to spend my time on"    INTERVAL HISTORY:   - Has been overall well. Discussed goals, wants to identify ways of fill her day.  - Used to be so busy.   - Wakes up 8:30, does steps daily, getting into cooking, sewing.  - But hard to get motivate, gain structure in her day.  - Processed feeling stuck. Pandemic affected her, staying inside to mininize exposure.  - Discussed the opportunity she has in retirement to pursue activities she values after 20+ yrs of f/t work. Conflicted between wanting more structure, but enjoying the freedom of retirement.  - stuck in some indecision, afraid of making "wrong decision" when stakes are high.  - looks into the future, assumes the worse, anxiety in decision. "Paralysis of choice".  - Discussed ways of planning her day, she identified making lists as a strength. Broke down two hobbies she'd like to pursue into baby steps, she's excited to start tackling the list.  - Emphasized self-compassion and flexibility in her planning, acknowledging that she appreciates BOTH structure and freedom.    CURRENT MEDICATIONS:    Current Outpatient Medications   Medication Sig    escitalopram (LEXAPRO) 20 MG tablet Take 1 tablet by mouth daily     No current facility-administered medications for this visit.         LANGUAGE OF CARE:    English      LANGUAGE NEEDS MET:    English Used Effectively By Patient    CURRENT TREATMENT/CONTACT INFO FOR OTHER AGENCIES AND MENTAL HEALTH PROVIDERS (if applicable):     Contact/Community Support    No documentation.                 COLUMBIA RISK ASSESSMENTS:   Suicide:   C-SSRS OP Last Contact Screener - 04/05/20 1550        C-SSRS OP Last Contact Screener    Have you wished you were dead or wished you could go to sleep and not wake up? No     Have you actually had any thoughts of killing yourself?  No     Have you done anything, started  to do anything, or prepared to do anything to end your life? No     C-SSRS OP Last Contact Screener Risk Score No Risk                C-SSRS Risk Assessment    No documentation.                 VIOLENCE:    Violence/Abuse Risk    No documentation.                    ADDITIONAL SCREENINGS:   PHQ9 SCREENING 11/24/2019 01/05/2020   Patient refused PHQ-9 No No   Little interest or pleasure in doing things Not at all Not at all   Feeling down, depressed, or hopeless Several Days Several Days   Trouble falling asleep, staying asleep, or sleeping too much Nearly Every Day Not at all   Feeling tired or having low energy Several Days Not at all   Poor appetite or overeating Not at all Not at all   Feeling bad about yourself - or that you are a failure or have let yourself or your family down More than Half the Days  Not at all   Trouble concentrating on things, such as school work, reading the newspaper, or watching television Several Days Not at all   Moving or speaking slowly that other people could have noticed.  Or the opposite - being so fidgety or restless that you have been  moving around a lot more than ususal Not at all Not at all   Thoughts that you would be better off dead or of hurting yourself in some way Not at all Not at all   TOTAL 8 1     GAD-7 FLOWSHEET 01/05/2020 11/24/2019   Feeling Nervous/Anxious/ On Edge 1 2   Unable to Stop Worrying 1 3   Worrying Too Much about Different Things 2 3   Trouble Relaxing 1 2   Restless/Hard to Sit Still 1 1   Easily Annoyed/Irritable 0 1   Afraid As is Something Aweful 1 1   How difficult has it made you to do work, take care of things at home, or get along with other people Not difficult at all -   GAD-7 Score 7 13          MENTAL STATUS EXAMINATION:   Mental Status Exam - 04/05/20 1549        Mental Status    General Appearance Appears stated age;Dressed appropriately     Behavior Cooperative;Socially appropriate     Level of Consciousness Alert     Orientation Level  Grossly Intact     Attention/Concentration WNL     Mannerisms/Movements No abnormal mannerisms/movements     Speech Rate WNL     Speech Clarity Clear     Speech Tone Normal vocal inflection     Vocabulary/Fund of Knowledge WNL     Memory Intact     Thought Process Goal-directed;Organized   Shifted topics often and easily, but open to redirection    Dissociative Symptoms None     Thought Content No abnormalities reported or observed     Hallucinations None     Suicidal Thoughts None     Homicidal Thoughts None     Mood Anxious     Affect Congruent w/mood;Bright     Judgment Good      Insight Good                  RISK ASSESSMENTS:       Violence: low (1)     Addiction: low (1)     CURRENT ASSESSMENT:  Pt was engaged throughout phone call. Reports stable mood, had a positive winter break visiting family. Themes discussed today include the challenges of making decisions, fear of making wrong choices, and strategies she's used in the past that help her structure and plan goals. Pt motivated to attend another [facility] therapy visit with this Probation officer.       DIAGNOSES:     GAD (generalized anxiety disorder)  (primary encounter diagnosis)   SNOMED CT(R): GENERALIZED ANXIETY DISORDER     Major depressive disorder, recurrent, mild (HCC)   SNOMED CT(R): RECURRENT MAJOR DEPRESSIVE EPISODES, MILD        REVIEWING TODAY'S VISIT  - Building alliance/rapport  - ACT exploration (value based living, adjusting to change)  - CBT indecision strategies  - Treatment planning    Pt was engaged and active throughout today's interventions.   Time Spent in Psychotherapy: 64mns    PLAN:   - Pt to follow-up for a [facility] teletherapy visit with Dr. MRandol Kernin 3 weeks.  - Will  continue to communicate with pt's pcp/care team.    If you have any questions about this note, please discuss with your provider at your next visit.        CARE PLAN/ EPISODES:  Linked Episodes   Type: Episode: Status: Noted: Resolved: Last update: Updated by:   [facility]  Active 03/15/2020  04/05/2020  3:00 PM Lacinda Axon, PhD      Comments:       Patient/Guardian:  contributed to the creation of the treatment plan.    Strengths/Skills: Intelligent, motivated for therapy, supportive family    Potential Barriers: Social isolation    Patient stated goals: Add more structure and purpose to life post retirement    Problem 1: Indecision and low motivation to start new projects    Short Term Goals: Follow plans to initiate new hobbies 50% of the time    Short Term Target:  90 days  Short TermTarget Date: 07/04/2020  Short Term Goal #1 Progress:       Long Term Goals: Keep up new activities in her life 100% of the time    Long Term Target:  90 days  Long TermTarget Date:  07/04/2020   Long Term Goal #1 Progress:         Intervention #1: ACT and CBT for adjusting to life change and facing indecision   Intervention Frequency:  as needed  Intervention Duration:  90 Days  Intervention Responsibility:  Pt and Clara Doroteo Glassman, PhD

## 2020-04-05 NOTE — BH OP Treatment Plan (Signed)
Patient/Guardian:  contributed to the creation of the treatment plan.    Strengths/Skills: Intelligent, motivated for therapy, supportive family    Potential Barriers: Social isolation    Patient stated goals: Add more structure and purpose to life post retirement    Problem 1: Indecision and low motivation to start new projects    Short Term Goals: Follow plans to initiate new hobbies 50% of the time    Short Term Target:  90 days  Short TermTarget Date: 07/04/2020  Short Term Goal #1 Progress:       Long Term Goals: Keep up new activities in her life 100% of the time    Long Term Target:  90 days  Long TermTarget Date:  07/04/2020   Long Term Goal #1 Progress:         Intervention #1: ACT and CBT for adjusting to life change and facing indecision   Intervention Frequency:  as needed  Intervention Duration:  90 Days  Intervention Responsibility:  Pt and Shelena Castelluccio Jon Billings

## 2020-04-29 ENCOUNTER — Ambulatory Visit: Payer: Medicare HMO | Attending: Psychosomatic Medicine | Admitting: Psychologist

## 2020-04-29 DIAGNOSIS — F330 Major depressive disorder, recurrent, mild: Secondary | ICD-10-CM | POA: Diagnosis present

## 2020-04-29 DIAGNOSIS — F411 Generalized anxiety disorder: Secondary | ICD-10-CM
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
## 2020-06-02 ENCOUNTER — Telehealth (HOSPITAL_BASED_OUTPATIENT_CLINIC_OR_DEPARTMENT_OTHER): Payer: Self-pay | Admitting: Internal Medicine

## 2020-06-02 ENCOUNTER — Ambulatory Visit: Payer: Medicare HMO | Attending: Internal Medicine | Admitting: Internal Medicine

## 2020-06-02 ENCOUNTER — Encounter (HOSPITAL_BASED_OUTPATIENT_CLINIC_OR_DEPARTMENT_OTHER): Payer: Self-pay | Admitting: Internal Medicine

## 2020-06-02 ENCOUNTER — Other Ambulatory Visit: Payer: Self-pay

## 2020-06-02 VITALS — BP 136/84 | HR 76 | Wt 170.0 lb

## 2020-06-02 DIAGNOSIS — E785 Hyperlipidemia, unspecified: Secondary | ICD-10-CM | POA: Diagnosis present

## 2020-06-02 DIAGNOSIS — F419 Anxiety disorder, unspecified: Secondary | ICD-10-CM | POA: Insufficient documentation

## 2020-06-02 DIAGNOSIS — F3340 Major depressive disorder, recurrent, in remission, unspecified: Secondary | ICD-10-CM | POA: Diagnosis present

## 2020-06-02 DIAGNOSIS — M25561 Pain in right knee: Secondary | ICD-10-CM | POA: Insufficient documentation

## 2020-06-02 DIAGNOSIS — M25649 Stiffness of unspecified hand, not elsewhere classified: Secondary | ICD-10-CM

## 2020-06-02 DIAGNOSIS — Z13820 Encounter for screening for osteoporosis: Secondary | ICD-10-CM | POA: Diagnosis not present

## 2020-06-02 DIAGNOSIS — R030 Elevated blood-pressure reading, without diagnosis of hypertension: Secondary | ICD-10-CM | POA: Diagnosis present

## 2020-06-02 DIAGNOSIS — F330 Major depressive disorder, recurrent, mild: Secondary | ICD-10-CM

## 2020-06-02 DIAGNOSIS — Z23 Encounter for immunization: Secondary | ICD-10-CM

## 2020-06-02 MED ORDER — ESCITALOPRAM OXALATE 20 MG PO TABS
20.0000 mg | ORAL_TABLET | Freq: Every day | ORAL | 0 refills | Status: DC
Start: 2020-06-02 — End: 2020-09-19

## 2020-06-02 NOTE — Progress Notes (Signed)
06/02/2020 PCV 13  VIS given prior to administration and reviewed with the patient and or legal guardian. Patient/legal guardian understands the disease and the vaccine. See immunization/Injection module or chart review for date of publication and additional information.  Advit Trethewey, LPN
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
## 2020-06-02 NOTE — Telephone Encounter (Signed)
nurse from [facility] called [facility] to complete a benefit analysis for the pcv13 Vaccine.    The vaccine is covered under the patient’s [place] medical coverage.    Please choose Private

## 2020-06-06 DIAGNOSIS — M25561 Pain in right knee: Secondary | ICD-10-CM | POA: Insufficient documentation

## 2020-06-06 DIAGNOSIS — M25649 Stiffness of unspecified hand, not elsewhere classified: Secondary | ICD-10-CM | POA: Insufficient documentation

## 2020-06-09 ENCOUNTER — Encounter (HOSPITAL_BASED_OUTPATIENT_CLINIC_OR_DEPARTMENT_OTHER): Payer: Self-pay | Admitting: Internal Medicine

## 2020-06-09 DIAGNOSIS — M25561 Pain in right knee: Secondary | ICD-10-CM

## 2020-06-15 ENCOUNTER — Other Ambulatory Visit (HOSPITAL_BASED_OUTPATIENT_CLINIC_OR_DEPARTMENT_OTHER): Payer: Self-pay | Admitting: Physician Assistant

## 2020-06-15 DIAGNOSIS — G8929 Other chronic pain: Secondary | ICD-10-CM

## 2020-06-17 ENCOUNTER — Ambulatory Visit (HOSPITAL_BASED_OUTPATIENT_CLINIC_OR_DEPARTMENT_OTHER): Admit: 2020-06-17 | Discharge: 2020-06-17 | Disposition: A | Discharge status: Home / Self Care | Payer: Medicare HMO

## 2020-06-17 ENCOUNTER — Ambulatory Visit
Admission: RE | Admit: 2020-06-17 | Discharge: 2020-06-17 | Disposition: A | Discharge status: Home / Self Care | Payer: Medicare HMO | Attending: Physician Assistant | Admitting: Physician Assistant

## 2020-06-17 ENCOUNTER — Other Ambulatory Visit: Payer: Self-pay

## 2020-06-17 ENCOUNTER — Ambulatory Visit (HOSPITAL_BASED_OUTPATIENT_CLINIC_OR_DEPARTMENT_OTHER): Payer: Medicare HMO | Admitting: Orthopaedic Surgery

## 2020-06-17 VITALS — BP 135/86 | HR 68 | Ht 67.5 in | Wt 170.0 lb

## 2020-06-17 DIAGNOSIS — G8929 Other chronic pain: Secondary | ICD-10-CM

## 2020-06-17 DIAGNOSIS — M1711 Unilateral primary osteoarthritis, right knee: Secondary | ICD-10-CM

## 2020-06-17 DIAGNOSIS — M76891 Other specified enthesopathies of right lower limb, excluding foot: Secondary | ICD-10-CM | POA: Insufficient documentation

## 2020-06-17 DIAGNOSIS — Z0189 Encounter for other specified special examinations: Secondary | ICD-10-CM | POA: Insufficient documentation

## 2020-06-17 MED ORDER — DICLOFENAC SODIUM 1 % EX GEL
4.00 g | Freq: Two times a day (BID) | CUTANEOUS | 1 refills | Status: AC | PRN
Start: 2020-06-17 — End: 2020-07-17

## 2020-06-17 NOTE — Progress Notes (Signed)
Date of service:  06/17/2020    Consultation requested by: Toniann Fail    Chief complaint: Right knee pain    HPI: This is a 65 year old year old female who presents for a year of atraumatic right knee pain.  She has had some mild symptoms over the past year but has been getting prickly worse over the past month.  She localizes pain over the posterior aspect of the knee primarily.  She notes it will feel stiff.  She notes pain in deep flexion.  Most of her symptoms are at night when lying down.  It does not bother her distinctly with her activities.  She does try to walk for exercise.  She has been using heat on it.  She denies swelling, locking, catching or instability.    Past Medical History: Anxiety    Surgical History: Herniorrhaphy.  Appendectomy.  Hysterectomy.  Right wrist cyst excision.  Bunionectomy.  Left knee arthroscopic meniscus surgery    Allergies:   Review of Patient's Allergies indicates:   Contrast dye [iopam*    Hives    Comment:MRI    Medications:     Current Outpatient Medications:     diclofenac (VOLTAREN) 1 % GEL Gel, Apply 4 g topically 2 (two) times daily as needed, Disp: 100 g, Rfl: 1    escitalopram (LEXAPRO) 20 MG tablet, Take 1 tablet by mouth daily, Disp: 90 tablet, Rfl: 0    Social History: She is retired.  She currently lives alone.  She denies tobacco use.    Family History: Reviewed and noted in new patient form scanned into epic record for this date of service.    Review of Systems: 11 systems reviewed and noted in the new patient form scanned into the Epic record for this date of service.    Physical Exam:  Vital Signs: BP 135/86    Pulse 68    Ht 5' 7.5" (1.715 m)    Wt 77.1 kg (170 lb)    SpO2 97%    BMI 26.23 kg/m   Gen: Alert and oriented x3, well appearing in no acute distress. Normal affect and mood.  Normal pupil dilation, extraocular motions intact  Nonlabored breathing  MSK: Right lower extremity states neutral alignment.  Skin intact.  Normal gait.  No effusion.   Motion 0-1 35 degrees.  Collateral and cruciate ligaments are stable.  There is no tenderness over the joint line or parapatellar tissues.  There is mild tenderness over the proximal gastrocnemius tendons.  There is pain in deep flexion.  McMurray's test negative.  No pain with patella grind/inhibition/apprehension.  Poor quad tone bilaterally.  Strength 5/5.  Sensation intact.  Pedal pulses 2+    Radiology: Right knee films are personally reviewed.    There is mild narrowing in the medial and patellofemoral compartments with marginal osteophytes.  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  Her symptoms seem more consistent with tendinitis in the back of her knee mostly over the gastrocnemius tendons.  While she does have some mild underlying arthritis I do not think she is distinctly symptomatic from that.  We reviewed conservative modalities and activity recommendations.  She already has a physical therapy referral for which I encouraged her to schedule.  I did also prescribe her diclofenac gel.  She may follow-up with me as needed.    All questions  were answered.    This note was prepared using voice recognition software.  Please disregard any transcription errors.

## 2020-06-22 ENCOUNTER — Other Ambulatory Visit: Payer: Self-pay

## 2020-06-22 ENCOUNTER — Ambulatory Visit
Admission: RE | Admit: 2020-06-22 | Discharge: 2020-06-22 | Disposition: A | Discharge status: Home / Self Care | Payer: Medicare HMO | Attending: Internal Medicine | Admitting: Internal Medicine

## 2020-06-22 DIAGNOSIS — M810 Age-related osteoporosis without current pathological fracture: Secondary | ICD-10-CM | POA: Diagnosis present

## 2020-06-22 DIAGNOSIS — Z780 Asymptomatic menopausal state: Secondary | ICD-10-CM | POA: Diagnosis not present

## 2020-06-22 DIAGNOSIS — Z13820 Encounter for screening for osteoporosis: Secondary | ICD-10-CM | POA: Diagnosis not present

## 2020-07-01 ENCOUNTER — Ambulatory Visit: Payer: Medicare HMO | Attending: Podiatrist | Admitting: Podiatrist

## 2020-07-01 ENCOUNTER — Other Ambulatory Visit: Payer: Self-pay

## 2020-07-01 DIAGNOSIS — B351 Tinea unguium: Secondary | ICD-10-CM

## 2020-07-02 NOTE — Progress Notes (Signed)
CC: Fungal toenails    HPI  Denise Gutierrez is a 65 year old  female who presents to clinic for continued evaluation of fungal toenail bilaterally.  She has been followed since last September and has undergone pulsed dosing regimen for terbinafine. She reports the nails continue to look improved though fungus is present still a bit.       ROS: Per HPI above, all systems were reviewed and are negative.     OBJECTIVE:  General: AAOx3. NAD. Pleasant. Non toxic appearance.   Vascular: DP and PT pulses palpable, b/l. Capillary fill time brisk to distal digits. No increase in skin temperature gradient. No pedal edema noted.   Neurologic: Gross sensation intact to foot and ankle b/l.  Dermatologic: See media tab for updated pictures.  All toenails with some white discoloration distally which appears to have improved from previous picture seen from 11/2019.  Hallux, second digit nails are mostly affected and not fully resolved.  Otherwise skin is intact with no lesions.  Orthopedic: Moving all digits.  No areas of pain noted to the foot or ankle bilaterally.  Ambulates independently in conventional shoe gears.      ASSESSMENT:  Onychomycosis    PLAN:  Patient was seen and examined today. She has improved significantly but I did express to her that this likely will not resolve completely. She did have LFT's earlier this winter which do not appear elevated. She will follow up as needed.

## 2020-07-27 ENCOUNTER — Encounter (HOSPITAL_BASED_OUTPATIENT_CLINIC_OR_DEPARTMENT_OTHER): Payer: Self-pay | Admitting: Psychologist

## 2020-07-27 NOTE — Progress Notes (Signed)
PSYCHIATRY TERMINATION AND TRANSFER NOTE    Termination Document    Date treatment started: 03/15/2020    Transfer/termination date: 07/27/2020    Reason for treatment: Depression    Treatment course (response to medications, compliance):   Pt presented to [facility] evaluation for the tx of depressive sxs in the context of life stage stress retiring after many yrs of work.  Pt attended 2 follow up sessions with this provider which focused on processing the grief over change in the face of retirement, and starting to build up a list of personal interests she'd like to undertake in her now more open schedule. She was last seen on 04/29/2020 where she reported improved mood and was engaging in some pleasant activities and effective acceptance-based self-talk. She was offered a booster session, which she did not schedule, so termination at this time.      The patient's expressiveness, intelligence, and interpersonal warmth could serve them well in future psychotherapy treatments.     Outstanding Issues:   - None    Safe to refill: N/A    Risk level:   Suicide: Low   Violence: Low   Addiction: Low     Plan: Termination at this time. Pt is aware that they are able to initiate another course of short-term tx at [facility] in the future if indicated.     For transfers, the treatment plan will now be the responsibility of: not applicable

## 2020-09-19 ENCOUNTER — Other Ambulatory Visit (HOSPITAL_BASED_OUTPATIENT_CLINIC_OR_DEPARTMENT_OTHER): Payer: Self-pay | Admitting: Internal Medicine

## 2020-09-19 DIAGNOSIS — F419 Anxiety disorder, unspecified: Secondary | ICD-10-CM

## 2020-09-19 DIAGNOSIS — F3340 Major depressive disorder, recurrent, in remission, unspecified: Secondary | ICD-10-CM

## 2020-09-19 NOTE — Telephone Encounter (Signed)
Per Pharmacy, Denise Gutierrez is a 65 year old female has requested a refill of ESCITALPRAM.      Last Office Visit: 06/02/2020 with PCP  Last Physical Exam: 01/05/2020    There are no preventive care reminders to display for this patient.    Other Med Adult:  Most Recent BP Reading(s)  06/17/20 : 135/86        Cholesterol (mg/dL)   Date Value   59/74/7185 235     LOW DENSITY LIPOPROTEIN DIRECT (mg/dL)   Date Value   50/15/8682 151     HIGH DENSITY LIPOPROTEIN (mg/dL)   Date Value   57/49/3552 63     TRIGLYCERIDES (mg/dL)   Date Value   17/47/1595 112         THYROID SCREEN TSH REFLEX FT4 (uIU/mL)   Date Value   01/05/2020 2.100         No results found for: TSH    HEMOGLOBIN A1C (%)   Date Value   01/05/2020 5.3       No results found for: POCA1C      INR CARE EVERYWHERE (no units)   Date Value   10/22/2017 1.04       SODIUM (mmol/L)   Date Value   01/05/2020 140       POTASSIUM (mmol/L)   Date Value   01/05/2020 4.3           CREATININE (mg/dL)   Date Value   39/67/2897 0.7       Documented patient preferred pharmacies:    [id number] [address], [place] - [address]  Phone: [phone] Fax: [phone]

## 2020-10-20 ENCOUNTER — Other Ambulatory Visit

## 2020-10-25 ENCOUNTER — Encounter

## 2020-10-25 ENCOUNTER — Other Ambulatory Visit

## 2020-10-26 ENCOUNTER — Ambulatory Visit: Attending: Ophthalmology | Admitting: Ophthalmology

## 2020-10-26 DIAGNOSIS — H0270 Unspecified degenerative disorders of eyelid and periocular area: Secondary | ICD-10-CM

## 2020-10-26 NOTE — Progress Notes (Signed)
 Lower eyelid fat herniation - good candidate for transconj lower bleph. R/b/a discussed.

## 2020-10-28 ENCOUNTER — Encounter (HOSPITAL_BASED_OUTPATIENT_CLINIC_OR_DEPARTMENT_OTHER): Admitting: Ophthalmology

## 2020-11-24 ENCOUNTER — Encounter (HOSPITAL_BASED_OUTPATIENT_CLINIC_OR_DEPARTMENT_OTHER): Payer: Self-pay | Admitting: Internal Medicine

## 2020-11-24 ENCOUNTER — Ambulatory Visit: Payer: Medicare HMO | Attending: Internal Medicine | Admitting: Internal Medicine

## 2020-11-24 ENCOUNTER — Other Ambulatory Visit: Payer: Self-pay

## 2020-11-24 VITALS — BP 132/80 | HR 74 | Ht 67.5 in | Wt 169.0 lb

## 2020-11-24 DIAGNOSIS — R030 Elevated blood-pressure reading, without diagnosis of hypertension: Secondary | ICD-10-CM | POA: Insufficient documentation

## 2020-11-24 DIAGNOSIS — F330 Major depressive disorder, recurrent, mild: Secondary | ICD-10-CM | POA: Diagnosis present

## 2020-11-24 DIAGNOSIS — R413 Other amnesia: Secondary | ICD-10-CM | POA: Diagnosis present

## 2020-11-24 DIAGNOSIS — Z1231 Encounter for screening mammogram for malignant neoplasm of breast: Secondary | ICD-10-CM | POA: Insufficient documentation

## 2020-11-24 DIAGNOSIS — M542 Cervicalgia: Secondary | ICD-10-CM | POA: Diagnosis present

## 2020-11-24 DIAGNOSIS — E785 Hyperlipidemia, unspecified: Secondary | ICD-10-CM | POA: Insufficient documentation

## 2020-11-24 DIAGNOSIS — F411 Generalized anxiety disorder: Secondary | ICD-10-CM | POA: Insufficient documentation

## 2020-11-24 DIAGNOSIS — F419 Anxiety disorder, unspecified: Secondary | ICD-10-CM

## 2020-11-24 LAB — CBC WITH PLATELET
ABSOLUTE NRBC COUNT: 0 10*3/uL (ref 0.0–0.0)
HEMATOCRIT: 45.9 % — ABNORMAL HIGH (ref 34.1–44.9)
HEMOGLOBIN: 14.4 g/dL (ref 11.2–15.7)
MEAN CORP HGB CONC: 31.4 g/dL (ref 31.0–37.0)
MEAN CORPUSCULAR HGB: 27.3 pg (ref 26.0–34.0)
MEAN CORPUSCULAR VOL: 87.1 fl (ref 80.0–100.0)
MEAN PLATELET VOLUME: 11.8 fL (ref 8.7–12.5)
NRBC %: 0 % (ref 0.0–0.0)
PLATELET COUNT: 313 10*3/uL (ref 150–400)
RBC DISTRIBUTION WIDTH STD DEV: 46 fL (ref 35.1–46.3)
RED BLOOD CELL COUNT: 5.27 M/uL — ABNORMAL HIGH (ref 3.90–5.20)
WHITE BLOOD CELL COUNT: 8.4 10*3/uL (ref 4.0–11.0)

## 2020-11-24 LAB — HEMOGLOBIN A1C
ESTIMATED AVERAGE GLUCOSE: 114 mg/dL (ref 74–160)
HEMOGLOBIN A1C: 5.6 % (ref 4.0–5.6)

## 2020-11-24 NOTE — Progress Notes (Signed)
Denise Gutierrez is a 65 year old female who presents for follow-up    Memory concern  Was in [place] and [place]  Past couple days forgoting more things than normal  Busy and not sleepy well  Just got back today    Anxiety/MDD  Happy with escitalopram 20mg     From last visit March 2022  From initial in person visit  Anxiety disorder, unspecified type  Major depressive disorder, recurrent, in remission(HCC)  - PHQ-9 score of 0 and GAd 7 score of 1  - Patient reports good mood and wishes to continue on escitalopram 20mg   - Continue to monitor  -     escitalopram (LEXAPRO) 20 MG tablet; Take 1 tablet by mouth daily    Spoke with MHCP  Has been trying to find therapist, has not connected so far  Anxiety disorder, unspecified type  Major depressive disorder, recurrent,in partial remission(HCC)  - PHQ-9 scorer of 1 and GAD 7 score of 7  - Continue Lexapro 10mg   - To continue to look for therapist as discussed with MHCP  - Recommend trying some of the apps given to patient by the MHCP  - Continue to monitor  From initial TV  On lexapro - needs refills 10mg  anxiety > depression  4 years on 10mg  the whole timeuntil recently started to run out  No other medications  Needs referral to htearpist  Many years ago had therapist yearsago for couple session but not in years    Other and unspecified hyperlipidemia  Has been resistant to med    From  Last visit [facility] 2022  Other and unspecified hyperlipidemia  - Discussed recommendation for statin to lower risks of CAD/MI/CVA  - Patient would like to work on diet and lifestyle prior to starting statin.   - with handout from [facility] on lifesyle changes to help lower cholesterol  - Will repeat lipids at next visit    From initial in person visit  - Review of CareEverywhere shows elevated lipids on prior checks  - Discussed will re-check today - will need to calculate ASCVD risk score. Also discussed given strong family history, may want to treat    Elevated BP without diagnosis of  hypertension  Most Recent BP Reading(s)  11/24/20 : 132/80  06/17/20 : 135/86  06/02/20 : 136/84  01/05/20 : 130/84  12/18/19 : 160/82    From last visit  Elevated BP without diagnosis of hypertension  - Continue to discuss BP is high  If increases further, would recommend antihypertensive to lower risks of uncontrolled BP (CAD, MI, CVA, CKD, etc)    From initial in person visit  - BP elevated. Discussed low sodium diet and other lifestyle measures to help control and improve BP. Given handouts on these   - Discussed if elevated further would recommend antihypertensive (particualrly given family hisotyr)  - Continue to monitor    Review of Systems: Denies fever, dizziness, headache, vision changes, CP, palpitations, DOE, reeflux, n/v, c/d/, abd pain, dysria, hematuira, BRBPR, melena, Le edema  Tightnes of neck   Hearing -   Sister has hearing aids    escitalopram (LEXAPRO) 20 MG tablet, TAKE 1 TABLET BY MOUTH EVERY DAY, Disp: 90 tablet, Rfl: 2    No current facility-administered medications for this visit.      Past Medical History:  No date: Depression  No date: Elevated cholesterol  No date: HTN (hypertension)  Patient Active Problem List    Neck pain  Date Noted: 11/28/2020      Right knee pain         Date Noted: 06/06/2020      Stiffness of hand joint         Date Noted: 06/06/2020      Other and unspecified hyperlipidemia         Date Noted: 01/05/2020      Elevated BP without diagnosis of hypertension         Date Noted: 11/24/2019      Snoring         Date Noted: 11/24/2019      Toenail deformity         Date Noted: 11/24/2019      Insomnia         Date Noted: 11/24/2019      Major depressive disorder, recurrent, mild (HCC)         Date Noted: 11/24/2019      GAD (generalized anxiety disorder)         Date Noted: 11/24/2019        Past Surgical History:  No date: APPENDECTOMY  No date: BREAST BIOPSY; Right      Comment:  neg.  No date: BREAST CYST EXCISION  No date: BUNIONECTOMY  No date: KNEE  CARTILAGE SURGERY      Comment:  left  No date: RPR 1ST INGUN HRNA PRETERM INFT RDC      Comment:  as child  No date: VAGINAL HYSTERECTOMY UTERUS 250 GM/<      Comment:  did not remove cervix - due to fibroids    ALLERGIES:  Review of Patient's Allergies indicates:   Contrast dye [iopam*    Hives    Comment:MRI    Immunization History   Administered Date(s) Administered   . Covid-19 Vaccine (Moderna - Full Dose) 02/22/2020, 08/14/2020   . Covid-19 Vaccine [employer] - Purple Cap) 06/27/2019, 07/19/2019   . Influenza Virus Quad Presv Free Vacc 6 Mo and Older, IM 04/15/2016, 12/04/2018   . Influenza Virus Tri Presv Free 3/> YRS IM 01/28/2015   . Influenza, Unspecified Formulation 02/22/2017   . PCV13 06/02/2020   . Tdap 01/05/2020   . Zoster Vacc (HZV) Recomb Adjv, IM, 2 Dose, (SHINGRIX) 12/21/2019       PHYSICAL EXAM:   11/24/20  1535 11/24/20  1615 11/24/20  1616   BP: 139/82 130/80 132/80   Site: Left Arm Right Arm Left Arm   Position: Sitting Sitting Sitting   Cuff Size: Regular Large Large   Pulse: 74     SpO2: 97%     Weight: 76.7 kg (169 lb)     Height: 5' 7.5" (1.715 m)       Constitutional: Well developed, Well nourished, No acute distress,  HEENT: NCAT, sclera anicteric  Neck: supple, no cervical spine TTP, tightness of paraspinal and trapezius muescles  CV:: RRR,NL S1/S2,  No murmurs, rubs or gallops.   Resp::CTAB, No respiratory distress, No wheezing, crackles, rhonchi  Abdomen: Soft, non-tender, non-distended, Normal bowel sounds.  Extremities:  WWP, No edema.  Neurologic:  No focal deficits noted.     ASSESSMENT/PLAN:  Denise Gutierrez was seen today for follow up.    Diagnoses and all orders for this visit:    Other and unspecified hyperlipidemia  - Discussed elevated lipids and recommendation for cholesterol medication. Reviewed high cholesterol increases risk for stroke/mini-strokes, CAD, MI, etc.  Reviewed medication decreases these risks.  Patiet willing to consider if still high at this  check  -     LIPID  PANEL  -     COMP METABOLIC PANEL, FASTING      Elevated BP without diagnosis of hypertension  - Repeat BP ok.     -     COMP METABOLIC PANEL, FASTING    GAD (generalized anxiety disorder)  Major depressive disorder, recurrent, mild (HCC)  - PHQ-9 score of 5 and GAD 7 score of 10.  No SI/Hi or thoughts of self harm  - Continue escitalopram 20mg   - Patient interested in connectin with MHCP for mindfullness and non-group activities  -     THYROID SCREEN TSH REFLEX FT4    Neck pain  - Tightness of muscles on exam  - Recommend trial of heating   -     REFERRAL TO PHYSICAL THERAPY (INT)    Memory difficulty  - Given just since travel, most likely related to recent travel with poor sleep (just got back today)  - Will check labs below  - Will have patient continue to monitor now that she is back home  -     THYROID SCREEN TSH REFLEX FT4  -     HEMOGLOBIN A1C  -     COMP METABOLIC PANEL, FASTING  -     CBC WITH PLATELET  -     LAB ADD ON/WRITE IN TEST  -     VITAMIN B12    Encounter for screening mammogram for malignant neoplasm of breast  - Discussed, will schedule after 1 year since last mammo  -     [facility] SCREENING MAMMO BILATERAL DIGITAL WITH DBT & CAD; Future

## 2020-11-25 LAB — COMP METABOLIC PANEL, FASTING
ALANINE AMINOTRANSFERASE: 23 U/L (ref 12–45)
ALBUMIN: 4.2 g/dL (ref 3.4–5.2)
ALKALINE PHOSPHATASE: 124 U/L — ABNORMAL HIGH (ref 45–117)
ANION GAP: 13 mmol/L (ref 10–22)
ASPARTATE AMINOTRANSFERASE: 17 U/L (ref 8–34)
BILIRUBIN TOTAL: 0.2 mg/dL (ref 0.2–1.0)
BUN (UREA NITROGEN): 19 mg/dL — ABNORMAL HIGH (ref 7–18)
CALCIUM: 10.1 mg/dl (ref 8.5–10.1)
CARBON DIOXIDE: 25 mmol/L (ref 21–32)
CHLORIDE: 103 mmol/L (ref 98–107)
CREATININE: 0.8 mg/dL (ref 0.4–1.2)
ESTIMATED GLOMERULAR FILT RATE: >60 mL/min (ref >60)
GLUCOSE FASTING: 97 mg/dL (ref 74–106)
POTASSIUM: 4.2 mmol/L (ref 3.5–5.1)
SODIUM: 141 mmol/L (ref 136–145)
TOTAL PROTEIN: 6.5 g/dL (ref 6.4–8.2)

## 2020-11-25 LAB — LIPID PANEL
Cholesterol: 255 mg/dL — ABNORMAL HIGH (ref 0–239)
HIGH DENSITY LIPOPROTEIN: 57 mg/dL (ref 40–60)
LOW DENSITY LIPOPROTEIN DIRECT: 175 mg/dL (ref 0–189)
TRIGLYCERIDES: 262 mg/dL — ABNORMAL HIGH (ref 0–150)

## 2020-11-25 LAB — THYROID SCREEN TSH REFLEX FT4: THYROID SCREEN TSH REFLEX FT4: 1.65 u[IU]/mL (ref 0.270–4.200)

## 2020-11-25 LAB — VITAMIN B12: VITAMIN B12: 890 pg/mL (ref 232–1245)

## 2020-11-28 DIAGNOSIS — M542 Cervicalgia: Secondary | ICD-10-CM | POA: Insufficient documentation

## 2020-11-29 ENCOUNTER — Encounter (HOSPITAL_BASED_OUTPATIENT_CLINIC_OR_DEPARTMENT_OTHER): Payer: Self-pay

## 2020-12-06 ENCOUNTER — Telehealth (HOSPITAL_BASED_OUTPATIENT_CLINIC_OR_DEPARTMENT_OTHER): Payer: Self-pay

## 2020-12-06 NOTE — Telephone Encounter (Signed)
Care Partner Note    Type of encounter:  Follow-up    Patient was referred to care partner through:  Inbasket message from PCP or PA    Reason for encounter: f/u with patient on anxiety resources sent through mychart    Assessment: CP called pt to check in and see if pt was able to use any of the anxiety resources. Pt states she is doing good. Pt states she looked over the [place] message with the resources but she still has not used any yet, has been busy. CP referred 2 bh apps.     To help you with the problem we discussed, I suggest you try the app  . Other patients have found it very helpful. A longer list of mental health and wellness apps [place] recommends for adult patients can be found at [email or web address].            Appears to be a Step 1 patient on the Stepped Model of Care    Patient Active Problem List:     Elevated BP without diagnosis of hypertension     Snoring     Toenail deformity     Insomnia     Major depressive disorder, recurrent, mild (HCC)     GAD (generalized anxiety disorder)     Other and unspecified hyperlipidemia     Right knee pain     Stiffness of hand joint     Neck pain      Medication: escitalopram (LEXAPRO) 20 MG tablet, TAKE 1 TABLET BY MOUTH EVERY DAY, Disp: 90 tablet, Rfl: 2    No current facility-administered medications on file prior to visit.      Relevant screening scores:   AWQ:   PHQ-9:   PHQ-9 TOTAL SCORE 11/24/2020 06/02/2020 01/05/2020   Doc FlowSheet Total Score 5 0 1     GAD-7:   GAD-7 Total 11/24/2020 06/02/2020 01/05/2020 11/24/2019   GAD-7 Score 10 1 7 13       Child/Adolescent:    How difficult have these problems made it for you to do your work, take care of things at home, or get along with people?  Unable to assess     Engagement/motivation for change  ( .pdf )  Contemplation    Primary brief Intervention provided during this encounter:  Emotional Support and Provided Resources    PLAN:  CP will f/u with pt in 2-3 weeks    TIME SPENT: <5 minutes     [email or web address] 12/[email or web address]/2020

## 2020-12-22 ENCOUNTER — Encounter (HOSPITAL_BASED_OUTPATIENT_CLINIC_OR_DEPARTMENT_OTHER): Payer: Self-pay

## 2021-02-01 ENCOUNTER — Ambulatory Visit
Admission: RE | Admit: 2021-02-01 | Discharge: 2021-02-01 | Disposition: A | Discharge status: Home / Self Care | Payer: Medicare HMO | Attending: Internal Medicine | Admitting: Internal Medicine

## 2021-02-01 ENCOUNTER — Other Ambulatory Visit: Payer: Self-pay

## 2021-02-01 ENCOUNTER — Encounter (HOSPITAL_BASED_OUTPATIENT_CLINIC_OR_DEPARTMENT_OTHER): Payer: Self-pay

## 2021-02-01 DIAGNOSIS — Z1231 Encounter for screening mammogram for malignant neoplasm of breast: Secondary | ICD-10-CM

## 2021-03-06 ENCOUNTER — Ambulatory Visit (HOSPITAL_BASED_OUTPATIENT_CLINIC_OR_DEPARTMENT_OTHER): Payer: Medicare HMO | Admitting: Internal Medicine

## 2021-03-06 ENCOUNTER — Encounter (HOSPITAL_BASED_OUTPATIENT_CLINIC_OR_DEPARTMENT_OTHER): Payer: Self-pay | Admitting: Internal Medicine

## 2021-03-06 ENCOUNTER — Ambulatory Visit: Payer: Medicare HMO | Attending: Family Medicine | Admitting: Family Medicine

## 2021-03-06 DIAGNOSIS — U071 COVID-19: Secondary | ICD-10-CM | POA: Diagnosis present

## 2021-03-06 MED ORDER — NIRMATRELVIR&RITONAVIR 300/100 20 X 150 MG & 10 X 100MG PO TBPK
ORAL_TABLET | ORAL | 0 refills | Status: AC
Start: 2021-03-06 — End: 2021-03-11

## 2021-03-06 NOTE — Telephone Encounter (Signed)
-----   Message from Harrie Foreman, LPN sent at 88/87/5797 12:39 PM EST -----  Regarding: covid +  [facility]  Hi Dr. Cheron Every:     I hope all is well.     I am having some symptoms from COVID - itchy throat, stuffy runny rose, slight headache, chills, and aches and pains.     Can you recommend anything that will ease my symptoms?     Thank you!     Denise Gutierrez

## 2021-03-06 NOTE — Telephone Encounter (Signed)
.  COVIDTRIAGE -- INITIAL TRIAGE:    I have confirmed the patient's name and DOB. Yes     Emergency care:    1. Is the patient patient gasping for air or unable to speak? No  2. Does the patient have new severe chest pain? No  3. Is the patient lethargic or does the patient have altered mental status?  No    A) COVID symptoms (criteria for testing):    The patient has the following NEW symptoms (past 14 days): Sore throat, Myalgias (body aches) and Other: headache    Current day of symptoms: 3    If dyspnea, duration of dyspnea: no    If fever, duration of fever: N/A    Other symptoms: headache    C) Need for in-person evaluation:    1. Is the patient experiencing respiratory symptoms (dypsnea, orthopnea, dyspnea on exertion, wheezing), worsening respiratory symptoms from baseline, or worsening of a chronic cough? No  2. Has the patient had a fever for > 48 hours? No  3. Is the patient at DOI ? 4 with worsening symptoms? No  4. Does the patient have chest pain (note: new severe chest pain must be referred to ED)? No  5. Is the patient dizzy or lightheaded? No  6. Does the patient have severe sore throat? No  7. Is the patient pregnant? No  8. Is the patient requesting in-person evaluation?  No    Disposition:    1. I have provided the patient with Home Care Advice (use COVIDHOMECARE). Yes   2. I have ordered testing (for symptomatic patients not requiring in-person evaluation, and asymptomatic contacts). N/A.  **Patients requiring testing only should be scheduled at [facility]. They should be scheduled at [facility] only if unable to get to [facility].**  3. I have given the patient directions to Drive-Thru Testing (use DRIVETHRUDIRECTIONS) or [facility] (ACCDIRECTIONS), if appropriate. N/A      Final disposition: Televisit scheduled with provider

## 2021-03-06 NOTE — Progress Notes (Signed)
S:   Covid positive yesterday   Sx x 3 d  Pain in heads to feet  A little better but still not feeling great     First infection     Has had 4 vaccines, last in July     Yesterday with fever, not today, up to 101  No shortness of breath, chest pain     O:  There were no vitals taken for this visit.    Paxlovid COVID visit        Denise Gutierrez is a 65 year old female patient of Jodean Lima, MD who is scheduled for a telemedicine visit for a new diagosis of COVID and to discuss medication options, particularly Paxlovid.     SUBJECTIVE:     Patient Active Problem List:     Elevated BP without diagnosis of hypertension     Snoring     Toenail deformity     Insomnia     Major depressive disorder, recurrent, mild (HCC)     GAD (generalized anxiety disorder)     Other and unspecified hyperlipidemia     Right knee pain     Stiffness of hand joint     Neck pain    Multiple Vitamins-Minerals (THERA-M) TABS, Take 1 tablet by mouth in the morning., Disp: , Rfl:   ascorbic acid (VITAMIN C) 500 MG tablet, Take 500 mg by mouth in the morning., Disp: , Rfl:   VITAMIN D PO, Take 600 mg by mouth daily, Disp: , Rfl:   escitalopram (LEXAPRO) 20 MG tablet, TAKE 1 TABLET BY MOUTH EVERY DAY, Disp: 90 tablet, Rfl: 2    No current facility-administered medications on file prior to visit.    All medications reviewed with patient.  Review of Patient's Allergies indicates:   Contrast dye [iopam*    Hives    Comment:MRI       Recent laboratories and imaging reviewed prior to visit.     COVID +:  -date of + test (antigen or PCR): yesterday   -date of symptom onset: 3 d; today is day 3 of symptoms  -symptoms include:      Counseling and care:     Home care advice:      a. Self isolation:     i. Stay home for a minimum of 5 days and until afebrile and symptoms improving, and then strict masking in public through day 10 (no restaurants or air travel): Yes    ii. Minimum last day of isolation: 12/19   b. Contacts:    i. All close  contacts (household members, people with whom you spent time up to 2 days before developed symptoms or tested) should be tested:   Yes    ii. All close contacts who are not "up to date" on vaccination should quarantine for a minimum of 5 days with masking for an additional 5 days and all vaccinated close contacts should wear a mask around others for 10 days: Yes     Eligibility criteria for Paxlovid:   ( > 48 yo, >40 kg)    Positive PCR or home antigen testing.    Onset of mild-moderate COVID symptoms: within the past 5 days   Never hospitalized during this covid course.    No new or increased oxygen requirement AND   At least one risk factor for progression to severe disease (see below to review and document applicable risk factor)     Risk factors for progression to severe  disease:  I have reviewed the following risk criteria and the patient qualifies for treatment based on: older adults, BMI >25, depression / mental illness   Older Adults   BMI > 25, Physical Inactivity   Pregnant or 6 wk postpartum   Chronic kidney, liver or lung disease   Dementia, Neurodevelopmental disorders, genetic/metabolic syndromes, severe congenital anomalies   Diabetes, Cardiovascular disease, cerebrovascular disease or hypertension   Immunosuppressive disease or therapy   Sickle cell disease   Disability   Mental Illness, including depression.    Smoking current or former, Substance Abuse   Race/ethnicity associated with greater rates of hospitalization and death from COVID due to the effects of systemic racism. Production designer, theatre/television/film, Hispanic/Latinx, American [place], [place] Native)    Any patient judged at higher risk for more severe disease for any other reason which would include unvaccinated, first covid course or more severe symptomatic course.     I have discussed the following with the patient:  All available Covid therapies that may be offered are investigational medicines authorized by the FDA. Yes  These  medicines may benefit some people with COVID from progressing to severe disease Yes     For paxlovid: Data showed a reduction in hospitalization(in unvaccinated patients) from circa 6% to 1%. Deaths were reduced from 10/612 in placebo to 0/602 when treated within 5 days.   Patients can choose whether to receive these medicines. Yes     Potential Condition or Medication Interactions:  I have reviewed the patients medication list and problem list in Epic and make appropriate updates as provided by the patient. Yes     I have reviewed the patient's most recent GFR which is >60 and reviewed the current UpToDate dosing guideline below.  Yes  eGFR =60 mL/minute: No dosage adjustment necessary.  eGFR =30 to <60 mL/minute: Nirmatrelvir 150 mg and ritonavir 100 mg twice daily.   eGFR <30 mL/minute: Use is not recommended.  If the patient does not have a GFR on record but has no reason to have kidney disease, no need to draw labs to obtain this. Can assume normal GFR.      I have reviewed the patient's problem list for cirrhosis. Yes  Only contraindicated in decompensated cirrhosis.     I have specifically reviewed the patient's medication list for the following potential interactions:     Temporarily withhold the other following medications:  Clonazepam (hold for at least 3 days after course completion)  Colchicine (hold for at least 3 days after course completion)  Salmeterol (hold for at least 3 days after course completion)     -Statins: My patient is either not on a statin or I have recommended that my patient hold their statin for 8 days during and after Paxlovid therapy.Yes      Adjust dose for the following medications:   Amlodipine (decrease by half or monitor x 8 days before and after)  Oxycodone (decrease dosage, watch for oversedation x 8 days before and after)  Tamsulosin (decrease to 0.50m and watch for orthostasis x 8 days before and after)  Trazodone (decrease by half or monitor x 8 days before and after)      Other Medications that are largely contraindicated and alternative therapy for Covid should be considered:     Phenytoin    Clopidogrel (reduces effectiveness of clopidogrel)    Phenobarbital    Clozaril    Lurasidone     Carbamazepine (reduces paxlovid)     Rifampin  St Liem Copenhaver's Wort     Active treatment for HCV    Antiarrhythmics, including Digoxin    Many Chemotherapeutic agents     -Warfarin and DOACs: (dabigatran (Pradaxa), rivaroxaban (Xarelto), apixaban (Eliquis), edoxaban (Savaysa), and betrixaban (Bevyxxa)): Paxlovid with these medications is largely contraindicated and alternate therapy for Covid should be considered.  Yes     For all other medications please use: Copy  Paxlovid with all other patient medications has been reviewed. Yes       If the patient does not qualify for paxlovid therapy consider alternate medication choices using .COVIDTXCOUNSELING      We recommend you use a back up form of birth control if you use hormonal contraception (hypothetical risk).     If the patient is pregnant or breastfeeding we recommend paxlovid or remdesivir only:   There is limited experience giving these medicines to pregnant people. However, pregnant people are known to be at greater risk for severe disease with COVID. N/A     Side Effects  Possible side effects have been studied:   include metallic taste and less common allergic reaction, hypertension, diarrhea and myalgias. Generally none are enough to cause patient to stop the medication.      We do not know all of the possible side effects as these medicines are still being studied. Yes     All other systems reviewed and negative.     OBJECTIVE:     Vital Signs:  Vitals not collected for this telemedicine visit.     Physical Exam:  General: speech clear at appropriate rate, speaking in full sentences  Psychiatric:  Mood and behavior normal      ASSESSMENT AND PLAN:  Additional plans reviewed with patient and listed below under  patient instructions and provided in AVS.  No diagnosis found.     The patient is not interested in [place] therapy.     If referring to the [facility] only. :   The med will default to "Transcribe for Prior Auth". This alerts [facility] to outreach to the patient to counsel the patient and arrange for medication pick up or delivery.      Offered letter for work or school and sent via [facility] or routed to front desk pool. N/A     Advised patient that if she develops new symptoms, she should call her clinic at [phone], or go to ED in an emergency: Yes     Disposition: Anticipatory guidance only, chart routed to PCP     I explained the diagnosis and treatment plan, and the patient/parent/guardian expressed understanding of the content. We discussed all medicines prescribed and the importance of medication adherence. The patient/parent/guardian expressed understanding and no barriers to adherence were identified.  Possible side effects of the prescribed medication(s) were explained.  I attempted to answer all questions regarding the diagnosis and the proposed treatment.       Eulogio Ditch, PA-C

## 2021-03-13 ENCOUNTER — Other Ambulatory Visit: Payer: Self-pay

## 2021-03-13 ENCOUNTER — Ambulatory Visit: Payer: Medicare HMO | Attending: Family Medicine | Admitting: Family Medicine

## 2021-03-13 DIAGNOSIS — Z006 Encounter for examination for normal comparison and control in clinical research program: Secondary | ICD-10-CM | POA: Insufficient documentation

## 2021-03-13 LAB — CBC, PLATELET & DIFFERENTIAL
ABSOLUTE BASO COUNT: 0.1 10*3/uL (ref 0.0–0.1)
ABSOLUTE EOSINOPHIL COUNT: 0.3 10*3/uL (ref 0.0–0.8)
ABSOLUTE IMM GRAN COUNT: 0.04 10*3/uL (ref 0.00–0.10)
ABSOLUTE LYMPH COUNT: 1.8 10*3/uL (ref 0.6–5.9)
ABSOLUTE MONO COUNT: 0.5 10*3/uL (ref 0.2–1.4)
ABSOLUTE NEUTROPHIL COUNT: 4.6 10*3/uL (ref 1.6–8.3)
ABSOLUTE NRBC COUNT: 0 10*3/uL (ref 0.0–0.0)
BASOPHIL %: 0.7 % (ref 0.0–1.2)
EOSINOPHIL %: 4.7 % (ref 0.0–7.0)
HEMATOCRIT: 46.8 % — ABNORMAL HIGH (ref 34.1–44.9)
HEMOGLOBIN: 14.9 g/dL (ref 11.2–15.7)
IMMATURE GRANULOCYTE %: 0.5 % (ref 0.0–1.0)
LYMPHOCYTE %: 24.6 % (ref 15.0–54.0)
MEAN CORP HGB CONC: 31.8 g/dL (ref 31.0–37.0)
MEAN CORPUSCULAR HGB: 27.5 pg (ref 26.0–34.0)
MEAN CORPUSCULAR VOL: 86.3 fl (ref 80.0–100.0)
MEAN PLATELET VOLUME: 11.8 fL (ref 8.7–12.5)
MONOCYTE %: 6.2 % (ref 4.0–13.0)
NEUTROPHIL %: 63.3 % (ref 40.0–75.0)
NRBC %: 0 % (ref 0.0–0.0)
PLATELET COUNT: 343 10*3/uL (ref 150–400)
RBC DISTRIBUTION WIDTH STD DEV: 45.4 fL (ref 35.1–46.3)
RED BLOOD CELL COUNT: 5.42 M/uL — ABNORMAL HIGH (ref 3.90–5.20)
WHITE BLOOD CELL COUNT: 7.3 10*3/uL (ref 4.0–11.0)

## 2021-03-13 LAB — COMPREHENSIVE METABOLIC PANEL
ALANINE AMINOTRANSFERASE: 22 U/L (ref 12–45)
ALBUMIN: 4.4 g/dL (ref 3.4–5.2)
ALKALINE PHOSPHATASE: 125 U/L — ABNORMAL HIGH (ref 45–117)
ANION GAP: 10 mmol/L (ref 10–22)
ASPARTATE AMINOTRANSFERASE: 19 U/L (ref 8–34)
BILIRUBIN TOTAL: 0.3 mg/dL (ref 0.2–1.0)
BUN (UREA NITROGEN): 24 mg/dL — ABNORMAL HIGH (ref 7–18)
CALCIUM: 10.4 mg/dL (ref 8.5–10.5)
CARBON DIOXIDE: 28 mmol/L (ref 21–32)
CHLORIDE: 103 mmol/L (ref 98–107)
CREATININE: 0.8 mg/dL (ref 0.4–1.2)
ESTIMATED GLOMERULAR FILT RATE: >60 mL/min (ref >60)
Glucose Random: 96 mg/dL (ref 74–160)
POTASSIUM: 4.8 mmol/L (ref 3.5–5.1)
SODIUM: 141 mmol/L (ref 136–145)
TOTAL PROTEIN: 7 g/dL (ref 6.4–8.2)

## 2021-03-13 LAB — URINALYSIS
BILIRUBIN, URINE: NEGATIVE
GLUCOSE, URINE: NEGATIVE MG/DL
KETONE, URINE: NEGATIVE MG/DL
LEUKOCYTE ESTERASE: NEGATIVE
NITRITE, URINE: NEGATIVE
OCCULT BLOOD, URINE: NEGATIVE
PH URINE: 5.5 (ref 5.0–8.0)
PROTEIN, URINE: NEGATIVE MG/DL
SPECIFIC GRAVITY URINE: 1.026 (ref 1.003–1.035)

## 2021-03-13 LAB — MICROALBUMIN RANDOM URINE
ALBUMIN URINE RANDOM: <1.2 mg/dL (ref 0.0–1.9)
CREATININE RANDOM URINE: 146 mg/dL (ref 28–217)

## 2021-03-13 LAB — VITAMIN D,25 HYDROXY: VITAMIN D,25 HYDROXY: 40 ng/mL (ref 30.0–100.0)

## 2021-03-13 LAB — D-DIMER PE/DVT, QUANTITATIVE: D-DIMER PE/DVT, QUANTITATIVE: 1903 ng/mLFEU (ref 0.00–499)

## 2021-03-13 LAB — NT-PROBNP: NT-proBNP: 45 pg/mL (ref 0–125)

## 2021-03-13 LAB — PROTHROMBIN TIME
INR: 1.1 (ref 2.0–3.5)
PROTHROMBIN TIME: 11.3 SECONDS (ref 9.6–12.3)

## 2021-03-13 LAB — SARS-COV-2 SPIKE ANTIBODY: SARS-CoV-2 SPIKE ANTIBODY: POSITIVE — AB

## 2021-03-13 LAB — FREE THYROXINE: FREE THYROXINE: 0.95 ng/dL (ref 0.93–1.70)

## 2021-03-13 LAB — LIPID PANEL
Cholesterol: 255 mg/dL — ABNORMAL HIGH (ref 0–239)
HIGH DENSITY LIPOPROTEIN: 48 mg/dL (ref 40–60)
LOW DENSITY LIPOPROTEIN DIRECT: 173 mg/dL (ref 0–189)
TRIGLYCERIDES: 213 mg/dL — ABNORMAL HIGH (ref 0–150)

## 2021-03-13 LAB — HEMOGLOBIN A1C
ESTIMATED AVERAGE GLUCOSE: 131 mg/dL (ref 74–160)
HEMOGLOBIN A1C: 6.2 % — ABNORMAL HIGH (ref 4.0–5.6)

## 2021-03-13 LAB — TROPONIN T HS RANDOM: TROPONIN T HS RANDOM: 8 ng/L (ref 0–10)

## 2021-03-13 LAB — C-REACTIVE PROTEIN HIGH SENS: C-REACTIVE PROTEIN HIGH SENS: 2 mg/L (ref 0–3)

## 2021-03-13 LAB — APTT: APTT: 31.2 SECONDS (ref 26.9–38.0)

## 2021-03-13 LAB — CYSTATIN C: CYSTATIN C: 0.98 mg/L (ref 0.62–1.16)

## 2021-03-13 LAB — BILIRUBIN DIRECT: BILIRUBIN DIRECT: <0.2 mg/dL (ref 0.0–0.2)

## 2021-03-13 LAB — TSH (THYROID STIMULATING HORMONE): TSH (THYROID STIM HORMONE): 2.56 u[IU]/mL (ref 0.270–4.200)

## 2021-03-15 ENCOUNTER — Encounter (HOSPITAL_BASED_OUTPATIENT_CLINIC_OR_DEPARTMENT_OTHER): Payer: Self-pay | Admitting: Family Medicine

## 2021-03-15 NOTE — Progress Notes (Signed)
Recover trial participant at [facility]    Consent obtained by Maekayla Giorgio.  Patient was capable of understanding the information   The study was explained fully and the study participant was given ample opportunity to ask questions  No study related procedures were completed prior to obtraining informed consent   The signed ICF was provided to the study participant     Labs drawn for RECOVER are for research purposes and will be shared with patient.    Clinically urgent labs will be notified by researcher, but should be correlated and evaluated by PCP team.    Non urgent, but abnormal labs may also be correlated by PCP team.

## 2021-03-15 NOTE — Progress Notes (Signed)
65 yo female / RECOVER participant (clinical trial)  Called to discuss Lab results from RECOVER which will be repeated as part of protocol in 10-12 weeks    Her D Dimer was elevated, which is common and of uncertain clinical significance in acute covid  She is not endorsing chest pain, unilateral swelling of extremities, or significantly worsened shortness of breath     She is welcome to call and discuss further with her PCP regarding clinical decisions around this elevated lab finding

## 2021-04-06 ENCOUNTER — Ambulatory Visit (HOSPITAL_BASED_OUTPATIENT_CLINIC_OR_DEPARTMENT_OTHER): Payer: Medicare HMO | Admitting: Internal Medicine

## 2021-04-09 ENCOUNTER — Encounter: Payer: Self-pay | Admitting: Emergency Medicine

## 2021-04-09 ENCOUNTER — Emergency Department: Payer: 59

## 2021-04-09 ENCOUNTER — Other Ambulatory Visit: Payer: Self-pay

## 2021-04-09 DIAGNOSIS — S0101XA Laceration without foreign body of scalp, initial encounter: Secondary | ICD-10-CM | POA: Insufficient documentation

## 2021-04-09 DIAGNOSIS — S0990XA Unspecified injury of head, initial encounter: Secondary | ICD-10-CM | POA: Diagnosis present

## 2021-04-09 DIAGNOSIS — W2209XA Striking against other stationary object, initial encounter: Secondary | ICD-10-CM | POA: Insufficient documentation

## 2021-04-09 NOTE — ED Triage Notes (Deleted)
Pt c/o right sided flank pain and central chest pain. Pt was admitted in November and had a bladder stent placed for a 21mm stone.

## 2021-04-09 NOTE — ED Triage Notes (Addendum)
Pt states that she hit the side of her head on the freezer door. Pt states that she was bleeding but no bleeding at this time. Denies LOC or blood thinners

## 2021-04-10 ENCOUNTER — Emergency Department
Admission: EM | Admit: 2021-04-10 | Discharge: 2021-04-10 | Disposition: A | Discharge status: Home / Self Care | Payer: 59 | Attending: Emergency Medicine | Admitting: Emergency Medicine

## 2021-04-10 DIAGNOSIS — S0990XA Unspecified injury of head, initial encounter: Secondary | ICD-10-CM

## 2021-04-10 DIAGNOSIS — S0101XA Laceration without foreign body of scalp, initial encounter: Secondary | ICD-10-CM

## 2021-04-10 HISTORY — DX: Depression, unspecified: F32.A

## 2021-04-10 HISTORY — DX: Anxiety disorder, unspecified: F41.9

## 2021-04-10 MED ORDER — ACETAMINOPHEN 500 MG PO TABS
1000.0000 mg | ORAL_TABLET | Freq: Once | ORAL | Status: AC
Start: 2021-04-10 — End: 2021-04-10
  Administered 2021-04-10: 1000 mg via ORAL
  Filled 2021-04-10: qty 2

## 2021-04-10 NOTE — ED Provider Notes (Signed)
[facility] Provider Note    Event Date/Time   First MD Initiated Contact with Patient 04/10/21 0013     (approximate)   History   Head Injury   HPI  Sara Zavala is a 66 y.o. female with history of anxiety and depression who presents to the emergency department after head injury that occurred just prior to arrival.  States she was cleaning out her refrigerator when she had the freezer door open and stood up and hit her head on the corner of the door.  She states that she immediately had bleeding from her scalp.  No loss of consciousness.  Not on blood thinners.  No numbness, tingling or weakness in her extremities.  No vomiting.  No vision changes or confusion.  States her last tetanus vaccination was within the past 5 years.   History provided by patient.      Past Medical History:  Diagnosis Date   Anxiety    Depression     Past Surgical History:  Procedure Laterality Date   ABDOMINAL HYSTERECTOMY     APPENDECTOMY     BREAST SURGERY      MEDICATIONS:  Prior to Admission medications   Not on File    Physical Exam   Triage Vital Signs: ED Triage Vitals [04/09/21 2230]  Enc Vitals Group     BP (!) 146/93     Pulse Rate 68     Resp 18     Temp 97.8 F (36.6 C)     Temp src      SpO2 99 %     Weight 160 lb (72.6 kg)     Height 5\' 8"  (1.727 m)     Head Circumference      Peak Flow      Pain Score 4     Pain Loc      Pain Edu?      Excl. in GC?     Most recent vital signs: Vitals:   04/09/21 2230 04/10/21 0112  BP: (!) 146/93 122/82  Pulse: 68 60  Resp: 18 16  Temp: 97.8 F (36.6 C)   SpO2: 99%      CONSTITUTIONAL: Alert and oriented and responds appropriately to questions. Well-appearing; well-nourished; GCS 15 HEAD: Normocephalic; patient has an approximately 5 mm superficial laceration to the posterior scalp that is hemostatic and well approximated EYES: Conjunctivae clear, PERRL, EOMI ENT: normal nose; no  rhinorrhea; moist mucous membranes; pharynx without lesions noted; no dental injury; no septal hematoma, no epistaxis; no facial deformity or bony tenderness NECK: Supple, no midline spinal tenderness, step-off or deformity; trachea midline CARD: RRR; S1 and S2 appreciated; no murmurs, no clicks, no rubs, no gallops RESP: Normal chest excursion without splinting or tachypnea; breath sounds clear and equal bilaterally; no wheezes, no rhonchi, no rales; no hypoxia or respiratory distress CHEST:  chest wall stable, no crepitus or ecchymosis or deformity, nontender to palpation; no flail chest ABD/GI: Normal bowel sounds; non-distended; soft, non-tender, no rebound, no guarding; no ecchymosis or other lesions noted PELVIS:  stable, nontender to palpation BACK:  The back appears normal; no midline spinal tenderness, step-off or deformity EXT: Normal ROM in all joints; non-tender to palpation; no edema; normal capillary refill; no cyanosis, no bony tenderness or bony deformity of patient's extremities, no joint effusion, compartments are soft, extremities are warm and well-perfused, no ecchymosis SKIN: Normal color for age and race; warm NEURO: No facial asymmetry, normal speech, moving all extremities  equally, normal sensation diffusely  ED Results / Procedures / Treatments   LABS: (all labs ordered are listed, but only abnormal results are displayed) Labs Reviewed - No data to display   EKG:    RADIOLOGY: My personal review and interpretation of imaging: CT head shows no acute abnormality.  I have personally reviewed all radiology reports. CT Head Wo Contrast  Result Date: 04/09/2021 CLINICAL DATA:  Status post trauma. EXAM: CT HEAD WITHOUT CONTRAST TECHNIQUE: Contiguous axial images were obtained from the base of the skull through the vertex without intravenous contrast. RADIATION DOSE REDUCTION: This exam was performed according to the departmental dose-optimization program which includes  automated exposure control, adjustment of the mA and/or kV according to patient size and/or use of iterative reconstruction technique. COMPARISON:  None. FINDINGS: Brain: There is mild cerebral atrophy with widening of the extra-axial spaces and ventricular dilatation. There are areas of decreased attenuation within the white matter tracts of the supratentorial brain, consistent with microvascular disease changes. Vascular: No hyperdense vessel or unexpected calcification. Skull: Normal. Negative for fracture or focal lesion. Sinuses/Orbits: No acute finding. Other: None. IMPRESSION: Generalized cerebral atrophy without evidence of an acute intracranial abnormality. Electronically Signed   By: Aram Candela M.D.   On: 04/09/2021 22:58     PROCEDURES:  Critical Care performed: No   CRITICAL CARE Performed by: Baxter Hire Alaysia Lightle   Total critical care time: 0 minutes  Critical care time was exclusive of separately billable procedures and treating other patients.  Critical care was necessary to treat or prevent imminent or life-threatening deterioration.  Critical care was time spent personally by me on the following activities: development of treatment plan with patient and/or surrogate as well as nursing, discussions with consultants, evaluation of patient's response to treatment, examination of patient, obtaining history from patient or surrogate, ordering and performing treatments and interventions, ordering and review of laboratory studies, ordering and review of radiographic studies, pulse oximetry and re-evaluation of patient's condition.   Procedures    IMPRESSION / MDM / ASSESSMENT AND PLAN / ED COURSE  I reviewed the triage vital signs and the nursing notes.  Patient here with head injury, scalp laceration that is now hemostatic.  No focal neurologic deficits.     DIFFERENTIAL DIAGNOSIS (includes but not limited to):   Scalp laceration, head injury, concussion, doubt intracranial  hemorrhage, skull fracture, CVA   PLAN: We will obtain head CT given patient is 65.  Her tetanus vaccine is up-to-date.  Will give Tylenol for headache.   MEDICATIONS GIVEN IN ED: Medications  acetaminophen (TYLENOL) tablet 1,000 mg (1,000 mg Oral Given 04/10/21 0105)     ED COURSE: CT head shows no acute abnormality.  She is neurologically intact here.  She does not need any repair of the superficial laceration as it is well approximated and hemostatic.  Discussed head injury return precautions.  She is comfortable with this plan.  Recommended over-the-counter Tylenol, ibuprofen as needed for pain.  She does not require any prescriptions for pain control today.   At this time, I do not feel there is any life-threatening condition present. I reviewed all nursing notes, vitals, pertinent previous records.  All labs, EKGs, imaging ordered have been independently reviewed and interpreted by myself.  I have reviewed nursing notes and appropriate previous records.  I feel the patient is safe to be discharged home without further emergent workup and can continue workup as an outpatient as needed. Discussed all findings and treatment plan with patient as  well as usual and customary return precautions.  They verbalize understanding and are comfortable with this plan.  Outpatient follow-up has been provided as needed. All questions have been answered.   CONSULTS: Admission not needed given normal head CT and patient is neurologically intact and has been stable for several hours in the ED.   OUTSIDE RECORDS REVIEWED: Reviewed patient's recent primary care doctor note on 11/24/2020.  Last tetanus vaccination documented as 01/05/2020.         FINAL CLINICAL IMPRESSION(S) / ED DIAGNOSES   Final diagnoses:  Injury of head, initial encounter  Scalp laceration, initial encounter     Rx / DC Orders   ED Discharge Orders     None        Note:  This document was prepared using Dragon voice  recognition software and may include unintentional dictation errors.   Maika Kaczmarek, Layla MawKristen N, DO 04/10/21 [phone]

## 2021-04-10 NOTE — Discharge Instructions (Signed)
You may alternate Tylenol 1000 mg every 6 hours as needed for pain, fever and Ibuprofen 600 mg every 8 hours as needed for pain, fever.  Please take Ibuprofen with food.  Do not take more than 4000 mg of Tylenol (acetaminophen) in a 24 hour period.

## 2021-06-15 ENCOUNTER — Other Ambulatory Visit (HOSPITAL_BASED_OUTPATIENT_CLINIC_OR_DEPARTMENT_OTHER): Payer: Self-pay | Admitting: Internal Medicine

## 2021-06-15 DIAGNOSIS — F3340 Major depressive disorder, recurrent, in remission, unspecified: Secondary | ICD-10-CM

## 2021-06-15 DIAGNOSIS — F419 Anxiety disorder, unspecified: Secondary | ICD-10-CM

## 2021-06-15 NOTE — Telephone Encounter (Signed)
PER Pharmacy, Denise Gutierrez is a 66 year old female has requested a refill of  - escitalopram    Last Office Visit: 03/13/21 with Excell Seltzer    Last Physical Exam: 2021  There are no preventive care reminders to display for this patient.  Other Med Adult:  Most Recent BP Reading(s)  11/24/20 : 132/80        Cholesterol (mg/dL)   Date Value   42/68/3419 255 (H)     LOW DENSITY LIPOPROTEIN DIRECT (mg/dL)   Date Value   62/22/9798 173     HIGH DENSITY LIPOPROTEIN (mg/dL)   Date Value   92/01/9416 48     TRIGLYCERIDES (mg/dL)   Date Value   40/81/4481 213 (H)         THYROID SCREEN TSH REFLEX FT4 (uIU/mL)   Date Value   11/24/2020 1.650         TSH (THYROID STIM HORMONE) (uIU/mL)   Date Value   03/13/2021 2.560       HEMOGLOBIN A1C (%)   Date Value   03/13/2021 6.2 (H)       No results found for: POCA1C      INR (no units)   Date Value   03/13/2021 1.1     INR CARE EVERYWHERE (no units)   Date Value   10/22/2017 1.04       SODIUM (mmol/L)   Date Value   03/13/2021 141       POTASSIUM (mmol/L)   Date Value   03/13/2021 4.8           CREATININE (mg/dL)   Date Value   85/63/1497 0.8     Documented patient preferred pharmacies:    [id number] [address], [place] - [address]  Phone: [phone] Fax: [phone]    [facility], [place], [place] - [address]  Phone: [phone] Fax: [phone]

## 2021-07-05 ENCOUNTER — Ambulatory Visit: Payer: Medicare HMO | Attending: Family Medicine | Admitting: Family Medicine

## 2021-07-05 ENCOUNTER — Other Ambulatory Visit: Payer: Self-pay

## 2021-07-05 DIAGNOSIS — Z006 Encounter for examination for normal comparison and control in clinical research program: Secondary | ICD-10-CM

## 2021-07-05 LAB — URINALYSIS
BILIRUBIN, URINE: NEGATIVE
GLUCOSE, URINE: NEGATIVE MG/DL
KETONE, URINE: NEGATIVE MG/DL
LEUKOCYTE ESTERASE: NEGATIVE
NITRITE, URINE: NEGATIVE
OCCULT BLOOD, URINE: NEGATIVE
PH URINE: 5.5 (ref 5.0–8.0)
PROTEIN, URINE: NEGATIVE MG/DL
SPECIFIC GRAVITY URINE: 1.025 (ref 1.003–1.035)

## 2021-07-05 LAB — CBC, PLATELET & DIFFERENTIAL
ABSOLUTE BASO COUNT: 0.1 10*3/uL (ref 0.0–0.1)
ABSOLUTE EOSINOPHIL COUNT: 0.3 10*3/uL (ref 0.0–0.8)
ABSOLUTE IMM GRAN COUNT: 0.01 10*3/uL (ref 0.00–0.10)
ABSOLUTE LYMPH COUNT: 1.5 10*3/uL (ref 0.6–5.9)
ABSOLUTE MONO COUNT: 0.4 10*3/uL (ref 0.2–1.4)
ABSOLUTE NEUTROPHIL COUNT: 3.8 10*3/uL (ref 1.6–8.3)
ABSOLUTE NRBC COUNT: 0 10*3/uL (ref 0.0–0.0)
BASOPHIL %: 1 % (ref 0.0–1.2)
EOSINOPHIL %: 4.5 % (ref 0.0–7.0)
HEMATOCRIT: 44.9 % (ref 34.1–44.9)
HEMOGLOBIN: 14.4 g/dL (ref 11.2–15.7)
IMMATURE GRANULOCYTE %: 0.2 % (ref 0.0–1.0)
LYMPHOCYTE %: 24.1 % (ref 15.0–54.0)
MEAN CORP HGB CONC: 32.1 g/dL (ref 31.0–37.0)
MEAN CORPUSCULAR HGB: 28 pg (ref 26.0–34.0)
MEAN CORPUSCULAR VOL: 87.4 fl (ref 80.0–100.0)
MEAN PLATELET VOLUME: 11.7 fL (ref 8.7–12.5)
MONOCYTE %: 6.5 % (ref 4.0–13.0)
NEUTROPHIL %: 63.7 % (ref 40.0–75.0)
NRBC %: 0 % (ref 0.0–0.0)
PLATELET COUNT: 321 10*3/uL (ref 150–400)
RBC DISTRIBUTION WIDTH STD DEV: 46.3 fL (ref 35.1–46.3)
RED BLOOD CELL COUNT: 5.14 M/uL (ref 3.90–5.20)
WHITE BLOOD CELL COUNT: 6 10*3/uL (ref 4.0–11.0)

## 2021-07-05 LAB — TROPONIN T HS RANDOM: TROPONIN T HS RANDOM: 10 ng/L (ref 0–10)

## 2021-07-05 LAB — MICROALBUMIN RANDOM URINE
ALBUMIN URINE RANDOM: <1.2 mg/dL (ref 0.0–1.9)
CREATININE RANDOM URINE: 177 mg/dL (ref 28–217)

## 2021-07-05 LAB — APTT: APTT: 30.6 SECONDS (ref 26.9–38.0)

## 2021-07-05 LAB — D-DIMER PE/DVT, QUANTITATIVE: D-DIMER PE/DVT, QUANTITATIVE: 2039 ng/mLFEU (ref 0.00–499)

## 2021-07-05 LAB — HEMOGLOBIN A1C
ESTIMATED AVERAGE GLUCOSE: 114 mg/dL (ref 74–160)
HEMOGLOBIN A1C: 5.6 % (ref 4.0–5.6)

## 2021-07-05 LAB — PROTHROMBIN TIME
INR: 1 (ref 2.0–3.5)
PROTHROMBIN TIME: 11.2 SECONDS (ref 9.6–12.3)

## 2021-07-05 NOTE — Progress Notes (Signed)
Recover trial participant    Labs drawn for RECOVER are for research purposes and will be shared with patient.    Clinically urgent labs will be notified by researcher, but should be correlated and evaluated by PCP team.    Non urgent, but abnormal labs may also be correlated by PCP team.

## 2021-07-11 LAB — BILIRUBIN DIRECT: BILIRUBIN DIRECT: <0.2 mg/dL (ref 0.0–0.2)

## 2021-07-11 LAB — COMPREHENSIVE METABOLIC PANEL
ALANINE AMINOTRANSFERASE: 21 U/L (ref 12–45)
ALBUMIN: 4.1 g/dL (ref 3.4–5.2)
ALKALINE PHOSPHATASE: 116 U/L (ref 45–117)
ANION GAP: 9 mmol/L — ABNORMAL LOW (ref 10–22)
ASPARTATE AMINOTRANSFERASE: 21 U/L (ref 8–34)
BILIRUBIN TOTAL: 0.3 mg/dL (ref 0.2–1.0)
BUN (UREA NITROGEN): 22 mg/dL — ABNORMAL HIGH (ref 7–18)
CALCIUM: 10.4 mg/dL (ref 8.5–10.5)
CARBON DIOXIDE: 27 mmol/L (ref 21–32)
CHLORIDE: 107 mmol/L (ref 98–107)
CREATININE: 0.8 mg/dL (ref 0.4–1.2)
ESTIMATED GLOMERULAR FILT RATE: >60 mL/min (ref >60)
Glucose Random: 104 mg/dL (ref 74–160)
POTASSIUM: 4.5 mmol/L (ref 3.5–5.1)
SODIUM: 143 mmol/L (ref 136–145)
TOTAL PROTEIN: 6.7 g/dL (ref 6.4–8.2)

## 2021-07-11 LAB — CYSTATIN C
CYSTATIN C GFR: >60 mL/min (ref >60)
CYSTATIN C: 0.96 mg/L (ref 0.62–1.16)

## 2021-07-11 LAB — LIPID PANEL
Cholesterol: 259 mg/dL — ABNORMAL HIGH (ref 0–239)
HIGH DENSITY LIPOPROTEIN: 62 mg/dL — ABNORMAL HIGH (ref 40–60)
LOW DENSITY LIPOPROTEIN DIRECT: 180 mg/dL (ref 0–189)
TRIGLYCERIDES: 139 mg/dL (ref 0–150)

## 2021-07-11 LAB — TSH (THYROID STIMULATING HORMONE): TSH (THYROID STIM HORMONE): 2.43 u[IU]/mL (ref 0.270–4.200)

## 2021-07-11 LAB — FREE THYROXINE: FREE THYROXINE: 1 ng/dL (ref 0.93–1.70)

## 2021-07-11 LAB — C-REACTIVE PROTEIN HIGH SENS: C-REACTIVE PROTEIN HIGH SENS: 1 mg/L (ref 0–3)

## 2021-07-11 LAB — VITAMIN D,25 HYDROXY: VITAMIN D,25 HYDROXY: 38 ng/mL (ref 30.0–100.0)

## 2021-07-11 LAB — SARS-COV-2 SPIKE ANTIBODY: SARS-CoV-2 SPIKE ANTIBODY: POSITIVE — AB

## 2021-07-11 LAB — NT-PROBNP: NT-proBNP: 59 pg/mL (ref 0–125)

## 2021-07-13 ENCOUNTER — Other Ambulatory Visit: Payer: Self-pay

## 2021-07-13 ENCOUNTER — Encounter (HOSPITAL_BASED_OUTPATIENT_CLINIC_OR_DEPARTMENT_OTHER): Payer: Self-pay | Admitting: Internal Medicine

## 2021-07-13 ENCOUNTER — Ambulatory Visit: Payer: Medicare HMO | Attending: Internal Medicine | Admitting: Internal Medicine

## 2021-07-13 VITALS — BP 128/78 | HR 83 | Temp 97.7°F | Ht 67.0 in | Wt 172.0 lb

## 2021-07-13 DIAGNOSIS — Z23 Encounter for immunization: Secondary | ICD-10-CM | POA: Insufficient documentation

## 2021-07-13 DIAGNOSIS — M25552 Pain in left hip: Secondary | ICD-10-CM | POA: Diagnosis present

## 2021-07-13 DIAGNOSIS — M25512 Pain in left shoulder: Secondary | ICD-10-CM | POA: Diagnosis present

## 2021-07-13 DIAGNOSIS — E7849 Other hyperlipidemia: Secondary | ICD-10-CM

## 2021-07-13 DIAGNOSIS — F419 Anxiety disorder, unspecified: Secondary | ICD-10-CM | POA: Diagnosis present

## 2021-07-13 DIAGNOSIS — M25562 Pain in left knee: Secondary | ICD-10-CM | POA: Diagnosis present

## 2021-07-13 DIAGNOSIS — Z7189 Other specified counseling: Secondary | ICD-10-CM | POA: Diagnosis present

## 2021-07-13 DIAGNOSIS — R030 Elevated blood-pressure reading, without diagnosis of hypertension: Secondary | ICD-10-CM | POA: Diagnosis present

## 2021-07-13 DIAGNOSIS — F330 Major depressive disorder, recurrent, mild: Secondary | ICD-10-CM | POA: Insufficient documentation

## 2021-07-13 MED ORDER — MELOXICAM 7.5 MG PO TABS
7.5000 mg | ORAL_TABLET | Freq: Every day | ORAL | 0 refills | Status: DC | PRN
Start: 2021-07-13 — End: 2021-08-10

## 2021-07-13 MED ORDER — HYDROXYZINE HCL 10 MG PO TABS
ORAL_TABLET | ORAL | 0 refills | Status: DC
Start: 2021-07-13 — End: 2021-09-21

## 2021-07-13 NOTE — Progress Notes (Signed)
07/13/2021  VIS given prior to administration and reviewed with the patient and or legal guardian. Patient understands the disease and the vaccine. See immunization/Injection module or chart review for date of publication and additional information.  Ahrianna Siglin, RN

## 2021-07-13 NOTE — Progress Notes (Signed)
Denise Gutierrez is a 66 year old female who presents for follow-up    Joint pains  Snhoulder pain and hip and knee pain   Only on left side  No weakness or tinlgling    Tried firend' moobic was helpful   No warmth, swelling or erythema of any joints  Bump on right shoulder?    Elevated BP without diagnosis of HTN  Most Recent BP Reading(s)  07/13/21 : 128/78  11/24/20 : 132/80  06/17/20 : 135/86  06/02/20 : 136/84  01/05/20 : 130/84    Anxiety/MDD  Notes having increased anxiety/ panic 1-2 times a week  Interested in medication for this  Feels like escitalopram has been helpful so hesitant to change this    From last visit Sept 2022  Happy with escitalopram 20mg e    From last visit March 2022  From initial in person visit  Anxiety disorder, unspecified type  Major depressive disorder, recurrent,in remission(HCC)  - PHQ-9 score of 0 and GAd 7 score of 1  - Patient reports good mood and wishes to continue on escitalopram 20mg   - Continue to monitor  - escitalopram (LEXAPRO) 20 MG tablet; Take 1 tablet by mouth daily    Spoke with [facility]  Has been trying to find therapist, has not connected so far  Anxiety disorder, unspecified type  Major depressive disorder, recurrent,in partial remission(HCC)  - PHQ-9 scorer of 1 and GAD 7 score of 7  - Continue Lexapro 10mg   - To continue to look for therapist as discussed with [facility]  - Recommend trying some of the apps given to patient by the [facility]  - Continue to monitor  From initial TV  On lexapro - needs refills 10mg  anxiety > depression  4 years on 10mg  the whole timeuntil recently started to run out  No other medications  Needs referral to htearpist  Many years ago had therapist [facility]ago for couple session but not in years    Other and unspecified hyperlipidemia  Got lipids checked at study   The 10-year ASCVD risk score (Arnett DK, et al., 2019) is: 6.7%    Values used to calculate the score:      Age: 42 years      Sex: Female      Is Non-Hispanic African American:  No      Diabetic: No      Tobacco smoker: No      Systolic Blood Pressure: 0000000 mmHg      Is BP treated: No      HDL Cholesterol: 62 mg/dL      Total Cholesterol: 259 mg/dL    From last visit Sept 2022  - Has been resistant to med    From  visit [facility] 2022  Other and unspecified hyperlipidemia  - Discussed recommendation for statin to lower risks of CAD/MI/CVA  - Patient would like to work on diet and lifestyle prior to starting statin.   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 Also discussed given strong family history, may want to treat    Cholesterol (mg/dL)   Date Value   07/05/2021 259 (H)   03/13/2021 255 (H)   11/24/2020 255 (H)     LOW DENSITY LIPOPROTEIN  DIRECT (mg/dL)   Date Value   07/05/2021 180   03/13/2021 173   11/24/2020 175     HIGH DENSITY LIPOPROTEIN (mg/dL)   Date Value   07/05/2021 62 (H)   03/13/2021 48   11/24/2020 57     TRIGLYCERIDES (mg/dL)   Date Value   07/05/2021 139   03/13/2021 213 (H)   11/24/2020 262 (H)     H/o elecated A1c  Improved on last check   HEMOGLOBIN A1C (%)   Date Value   07/05/2021 5.6   03/13/2021 6.2 (H)   11/24/2020 5.6     Review of Systems:  Denies dizziness, headache, CP, SOB    escitalopram (LEXAPRO) 20 MG tablet, TAKE 1 TABLET BY MOUTH EVERY DAY, Disp: 90 tablet, Rfl: 0  Multiple Vitamins-Minerals (THERA-M) TABS, Take 1 tablet by mouth in the morning., Disp: , Rfl:   ascorbic acid (VITAMIN C) 500 MG tablet, Take 500 mg by mouth in the morning., Disp: , Rfl:   VITAMIN D PO, Take 600 mg by mouth daily, Disp: , Rfl:     No current facility-administered medications for this visit.      Past Medical History:  No date: Depression  No date: Elevated cholesterol  No date: HTN (hypertension)  Patient Active Problem List     Neck pain         Date Noted: 11/28/2020      Right knee pain         Date Noted: 06/06/2020      Stiffness of hand joint         Date Noted: 06/06/2020      Other hyperlipidemia         Date Noted: 01/05/2020      Elevated BP without diagnosis of hypertension         Date Noted: 11/24/2019      Snoring         Date Noted: 11/24/2019      Toenail deformity         Date Noted: 11/24/2019      Insomnia         Date Noted: 11/24/2019      Major depressive disorder, recurrent, mild ([place])         Date Noted: 11/24/2019      GAD (generalized anxiety disorder)         Date Noted: 11/24/2019      Past Surgical History:  No date: APPENDECTOMY  No date: BREAST BIOPSY; Right      Comment:  neg.  No date: BREAST CYST EXCISION  No date: BUNIONECTOMY  No date: KNEE CARTILAGE SURGERY      Comment:  left  No date: RPR 1ST INGUN HRNA PRETERM INFT RDC      Comment:  as child  No date: VAGINAL HYSTERECTOMY UTERUS 250 GM/<      Comment:  did not remove cervix - due to fibroids    ALLERGIES:  Review of Patient's Allergies indicates:   Contrast dye [iopam*    Hives    Comment:MRI    Immunization History   Administered Date(s) Administered   . Covid-19 Vaccine (Moderna - Full Dose) 02/22/2020, 08/14/2020   . Covid-19 Vaccine [employer] - Purple Cap) 06/27/2019, 07/19/2019   . Influenza Virus Quad Presv Free Vacc 6 Mo and Older, IM 04/15/2016, 12/04/2018   . Influenza Virus Tri Presv Free 3/> YRS IM 01/28/2015   . Influenza, Unspecified Formulation 02/22/2017   . PCV13 06/02/2020   .  PNEUMOCOCCAL POLYSACCHARIDE VACCINE v23 07/13/2021   . Tdap 01/05/2020   . Zoster Vacc (HZV) Recomb Adjv, IM, 2 Dose, (SHINGRIX) 12/21/2019       PHYSICAL EXAM:   07/13/21  1639 07/13/21  1748   BP: (!) 132/90 128/78   Site:  Left Arm   Position:  Sitting   Cuff Size:  Large   Pulse: 83    Temp: 97.7 F (36.5 C)    SpO2: 99%    Weight: 78 kg (172 lb)    Height: 5\' 7"  (1.702 m)      Constitutional: Well developed, Well nourished, No acute distress,  HEENT: NCAT,  , sclera anicteric  CV:: RRR,NL S1/S2,  No rubs or gallops.   Resp::CTAB, No respiratory distress, No wheezing, crackles, rhonchi  Abdomen: Soft, non-tender, non-distended, Normal bowel sounds.  MSK: Shoulder symmetric ("bump" pt noticed is just her shoulder bone and same on both sides).  Mild TTP of deltoid bursa on left side, no AC joint or posterior TTP, negative Hawkins-Kennedy, mildly decreased ROM on left  Extremities: I WWP, No edema.  Neurologic: Strength 5/5 and sensation to light touch grossly intact in upper and lower extemities    ASSESSMENT/PLAN:  Denise Gutierrez was seen today for follow up.    Diagnoses and all orders for this visit:    Anxiety disorder, unspecified type  Major depressive disorder, recurrent, mild (HCC)  - PHQ-9 score of 1 and GAD 7 score of 3  No S/HI or thoughts of self harm  - Patient feels escitalopgram has been very helpful to her but notes some increased episode of anxiety 1-2 times a week.  Discussed trial of prn med of hydroxyzine (will start low dose and titrate up).  Reduced risks of sedation/dizziness and should not drive after taking.  If anxiety becomes more often, could consider adding standing Buspar  - Will continue to monitor   -     hydrOXYzine (ATARAX) 10 MG tablet; Take 1 or 2 pills every 8 hours as needed for anxiety    Left shoulder pain, unspecified chronicity  Left hip pain  Left knee pain, unspecified chronicity  - No warmth, swelling, erythema of joints  - Recommend course of PT - referral placed and given number to call to schedule  - Pt took relative's Mobic once (counseled re: taking other's meds, pt aware).  Did help with pains.  Will prescribe low dose prn Mobic.  Discussed should take with food to avoid stomach upset (discussed NSAIDS can cause gastritis/bleeding) and also discussed NSAIDs can also raise BP if taken often/higher doses.  -     REFERRAL TO PHYSICAL THERAPY (INT)  -     meloxicam (MOBIC) 7.5 MG tablet; Take 1 tablet by mouth daily as needed for  Pain    Elevated BP without diagnosis of hypertension  - Repeat BP improved (still borderline)  - Will continue to encourage healthy diet and exercise  - Continue to monitor     Other hyperlipidemia  - Continues to be high on recent tests by clinical study  - Pt has been hesitant to take any cholesterol medication - will encourage to consider again    Need for prophylactic vaccination against Streptococcus pneumoniae (pneumococcus)  - due for second vaccine, agrees to receive, given  -     IMMUNIZATION ADMIN SINGLE  -     PPSV23 VACCINE 2 YRS OR OLDER FOR SUBQ/IM USE    Advance care planning  -     HEALTH  CARE PROXY    - Pt notes had high D dimer as part of a COVID study - unclear what this means as was checked as part of a study and NOT with any concern about blood clot.  Discussed this provider does not know ho to interpreter result in this context . Pt with no swelling or pain in calves and no SOB.  Normal O2 sat and normal HR

## 2021-07-17 ENCOUNTER — Encounter (HOSPITAL_BASED_OUTPATIENT_CLINIC_OR_DEPARTMENT_OTHER): Payer: Self-pay | Admitting: Family Medicine

## 2021-07-17 DIAGNOSIS — M25512 Pain in left shoulder: Secondary | ICD-10-CM | POA: Insufficient documentation

## 2021-07-17 DIAGNOSIS — M25562 Pain in left knee: Secondary | ICD-10-CM | POA: Insufficient documentation

## 2021-07-17 DIAGNOSIS — M25552 Pain in left hip: Secondary | ICD-10-CM | POA: Insufficient documentation

## 2021-07-18 ENCOUNTER — Encounter (HOSPITAL_BASED_OUTPATIENT_CLINIC_OR_DEPARTMENT_OTHER): Payer: Self-pay | Admitting: Internal Medicine

## 2021-07-18 DIAGNOSIS — E7849 Other hyperlipidemia: Secondary | ICD-10-CM

## 2021-07-18 MED ORDER — ATORVASTATIN CALCIUM 20 MG PO TABS
20.0000 mg | ORAL_TABLET | Freq: Every day | ORAL | 1 refills | Status: DC
Start: 2021-07-18 — End: 2022-01-15

## 2021-08-02 ENCOUNTER — Other Ambulatory Visit: Payer: Self-pay

## 2021-08-02 ENCOUNTER — Ambulatory Visit (HOSPITAL_BASED_OUTPATIENT_CLINIC_OR_DEPARTMENT_OTHER): Payer: Medicare HMO | Admitting: Rehabilitative and Restorative Service Providers"

## 2021-08-02 DIAGNOSIS — M25562 Pain in left knee: Secondary | ICD-10-CM | POA: Insufficient documentation

## 2021-08-02 DIAGNOSIS — M25552 Pain in left hip: Secondary | ICD-10-CM | POA: Insufficient documentation

## 2021-08-02 DIAGNOSIS — S46012S Strain of muscle(s) and tendon(s) of the rotator cuff of left shoulder, sequela: Secondary | ICD-10-CM | POA: Diagnosis present

## 2021-08-02 DIAGNOSIS — M25512 Pain in left shoulder: Secondary | ICD-10-CM | POA: Insufficient documentation

## 2021-08-02 NOTE — Progress Notes (Signed)
OUTPATIENT PHYSICAL THERAPY EVALUATION    Referring Physician: Letitia Caul, MD  Date of Onset: 06/2021    Subjective: Pt is a 66 y.o. female who presents to Physical Therapy with sub-acute on chronic L shoulder pain that is not improving. States that she does not remember any specific incident that precipitated the pain. States that the symptoms are described as a dull ache that is mild to moderate in nature. Symptoms stay local to the L shoulder. Denies any numbness or tingling symptoms. Reports occasionally feeling weakness in her L hand. States that she currently has difficulty sleeping, reaching overhead, and reaching out to the side.     Pre-morbid Functional Level: WFL    Occupation: Retired  Dressing/Grooming: mildly impaired  Driving: Normal  Sleeping: mildly impaired      Contraindications/Precautions: GAD, hx of frozen shoulder    Patient Active Problem List:     Elevated BP without diagnosis of hypertension     Snoring     Toenail deformity     Insomnia     Major depressive disorder, recurrent, mild (HCC)     GAD (generalized anxiety disorder)     Other hyperlipidemia     Right knee pain     Stiffness of hand joint     Neck pain     Left knee pain     Left hip pain     Left shoulder pain    atorvastatin (LIPITOR) 20 MG tablet, Take 1 tablet by mouth in the morning., Disp: 90 tablet, Rfl: 1  hydrOXYzine (ATARAX) 10 MG tablet, Take 1 or 2 pills every 8 hours as needed for anxiety, Disp: 30 tablet, Rfl: 0  meloxicam (MOBIC) 7.5 MG tablet, Take 1 tablet by mouth daily as needed for Pain, Disp: 30 tablet, Rfl: 0  escitalopram (LEXAPRO) 20 MG tablet, TAKE 1 TABLET BY MOUTH EVERY DAY, Disp: 90 tablet, Rfl: 0  Multiple Vitamins-Minerals (THERA-M) TABS, Take 1 tablet by mouth in the morning., Disp: , Rfl:   ascorbic acid (VITAMIN C) 500 MG tablet, Take 500 mg by mouth in the morning., Disp: , Rfl:   VITAMIN D PO, Take 600 mg by mouth daily, Disp: , Rfl:     No current facility-administered medications on file  prior to visit.      Mental Status/Communication: normal  Learns Best: practice  Primary Language: english; Requires Interpreter: no     Objective:  Please Note: Only populated fields were assessed by provider, fields left blank were not assessed.     LEFT A/PROM RIGHT A/PROM   CERVICAL       FLEX WNL      EXT WNL      SB       ROT WNL WNL   SHOULDER       FLEX 180 pain 180     ABD 180 pain 180     IR 80 90     ER 80 90   ELBOW       FLEX       EXT        LEFT STRENGTH RIGHT STRENGTH   SHOULDER       FLEX 4-/5 pain 5-/5     ABD 4-/5 pain 5-/5     IR 4+/5  5-/5     ER 4-/5 pain 4+/5   ELBOW       FLEX       EXT     SCAPULAR       EXT  MID TRAP       LOW TRAP       Posture: Forward head posture, bilateral rounded shoulders    Special Tests:     Painful arc sign: -R, +L  Supraspinatus test: -R, +L  Infraspinatus test: -R, +L  Hawkins kennedy: -R, -L  Speeds: -R, -L  Neers: -R, +L    Physical Therapy Plan of Care    MD: Ferd Glassing., MD  Referring Provider: Toniann Fail, MD  Diagnosis: L RTC strain    Assessment/Objective Findings:   Patient is a 66 year old female with complaints of sub-acute on chronic L shoulder pain that is not improving. Pt presents with decreased gross L shoulder strength, + painful arc sign, +supraspinatus test, and + infraspinatus test. This is contributing to her inability to tolerate sleeping, reaching overhead, and reaching out to the side. Physical Therapy is recommended in order to address these functional impairments and allow her to return to her prior level of function.       Short Term Goals: 4 weeks  Pt will increase gross L shoulder strength by 1/3 MMT  Pt will be able to tolerate reaching overhead  Pt will be independent in HEP  Pt will be able to tolerate sleeping on her side  Long Term Goals: 8 weeks  Same as STG    Treatment Plan:  ** PT Eval - Low Complexity (CPT 97161)  ** Stretching/ROM/Therapeutic Exercise (97110)  ** Home Exercise Program/ Patient Education (CPT  [phone])  ** Neuromuscular Re-education (CPT [phone])  ** Functional Activities (CPT 97530)    Recommend skilled physical therapy services for 1 times per week for 8 weeks. Updated plan of care will be completed every 30 days.    The rehabilitation potential for this patient is good. Clinical presentation is good d/t stable and unchanging.    [name] is aware of attendance policy: Yes  Plan of care discussed with Patient/Family: Yes  Patient goals reviewed and incorporated in plan of care: Yes  Patient/Family agrees with plan of care: Yes  Patient/Family education: Yes  Does patient feel safe at home: Yes      Denise Gutierrez, PT, Lic # [id number]

## 2021-08-02 NOTE — Progress Notes (Signed)
08/02/21 1500   Language Information   Language of Care English   Rehab Discipline   Rehab Discipline PT   Visit   Visit number 1   POC Due date 09/02/2021   Time Calculation   Start Time 0240   Stop Time 0310   Time Calculation (min) 30 min   Patient Education   What was taught? HEP; side lying shoulder ER, side lying shoulder horizontal abduction, side lying shoulder abduction   Method Verbal;Demo;Practice;Written   Patient comprehension Yes       Ellamae Sia, PT, Lic # [id number]

## 2021-08-02 NOTE — Progress Notes (Signed)
I certify that the documented Treatment Plan is reasonable and necessary.    08/02/2021  Ferd Glassing, MD

## 2021-08-10 ENCOUNTER — Other Ambulatory Visit (HOSPITAL_BASED_OUTPATIENT_CLINIC_OR_DEPARTMENT_OTHER): Payer: Self-pay | Admitting: Internal Medicine

## 2021-08-10 DIAGNOSIS — M25552 Pain in left hip: Secondary | ICD-10-CM

## 2021-08-10 DIAGNOSIS — M25512 Pain in left shoulder: Secondary | ICD-10-CM

## 2021-08-10 DIAGNOSIS — M25562 Pain in left knee: Secondary | ICD-10-CM

## 2021-08-10 NOTE — Telephone Encounter (Signed)
[facility], Denise Gutierrez is a 66 year old female has requested a refill of      -  Mobic       Last Office Visit: 07/13/2021 with Fuller Song  Last Physical Exam: 01/05/2020     There are no preventive care reminders to display for this patient.     Other Med Adult:  Most Recent BP Reading(s)  07/13/21 : 128/78        Cholesterol (mg/dL)   Date Value   07/05/2021 259 (H)     LOW DENSITY LIPOPROTEIN DIRECT (mg/dL)   Date Value   07/05/2021 180     HIGH DENSITY LIPOPROTEIN (mg/dL)   Date Value   07/05/2021 62 (H)     TRIGLYCERIDES (mg/dL)   Date Value   07/05/2021 139         THYROID SCREEN TSH REFLEX FT4 (uIU/mL)   Date Value   11/24/2020 1.650         TSH (THYROID STIM HORMONE) (uIU/mL)   Date Value   07/05/2021 2.430       HEMOGLOBIN A1C (%)   Date Value   07/05/2021 5.6       No results found for: POCA1C      INR (no units)   Date Value   07/05/2021 1.0   03/13/2021 1.1     INR CARE EVERYWHERE (no units)   Date Value   10/22/2017 1.04       SODIUM (mmol/L)   Date Value   07/05/2021 143       POTASSIUM (mmol/L)   Date Value   07/05/2021 4.5           CREATININE (mg/dL)   Date Value   07/05/2021 0.8        Documented patient preferred pharmacies:    [id number] - [place], [place] [address]  Phone: [phone] Fax: [phone]

## 2021-08-11 ENCOUNTER — Other Ambulatory Visit: Payer: Self-pay

## 2021-08-11 ENCOUNTER — Ambulatory Visit
Payer: Medicare HMO | Attending: Rehabilitative and Restorative Service Providers" | Admitting: Rehabilitative and Restorative Service Providers"

## 2021-08-11 DIAGNOSIS — M25512 Pain in left shoulder: Secondary | ICD-10-CM | POA: Diagnosis not present

## 2021-08-11 DIAGNOSIS — S46012S Strain of muscle(s) and tendon(s) of the rotator cuff of left shoulder, sequela: Secondary | ICD-10-CM

## 2021-08-11 NOTE — Progress Notes (Signed)
S: different movements I do I have pain   O: Refer to Rehabilitation Treatment Flowsheet   08/11/21 1300   Rehab Discipline   Rehab Discipline PT   Visit   Visit number 2   POC Due date 09/02/2021   Time Calculation   Start Time 0100   Stop Time 0130   Time Calculation (min) 30 min   Manual Therapy   Technique PROM assessment 3 min   Ther Exercise   Exercise AA shoulder flex 2# bar 2x10   Ther Exercise 2   Exercise SDL ER trialed 2# then modified to no wt 2x10   Ther Exercise 3   Exercise 3 SDL abd  2# 2X10   Ther Exercise 4   Exercise 4 supine serratus punch outs 3# 2X10   Ther Exercise 5   Exercise 5 ROWS AND cgpd rtb 2X15 EACH   Ther Exercise 6   Exercise 6 isom ir/er walkouts ir/er x10 RTB   Ther Exercise 7   Exercise 7 ball stabs on wall CW/CCW x2 20 sec   Patient Education   What was taught? HEP; side lying shoulder ER, side lying shoulder horizontal abduction, side lying shoulder abduction   Method Verbal;Demo;Practice;Written   Patient comprehension Yes       A: PROM all shoulder motions with end range discomfort with all motions - pt completed ex's noted with trial of resistance with SDL ER and modified with pt performing without resistance d/t increased discomfort - AA shoulder flexion with wt'd bar within pts pain free ranges only d/t end range discomfort - issued RTB for home use with pt performing resisted scap strengthening exs' with Vc's given for proper form - pt to ice at home post rx if needed   P: progress as per pts tolerance     Debara Pickett, PTA, Lic # [id number]

## 2021-08-30 ENCOUNTER — Ambulatory Visit (HOSPITAL_BASED_OUTPATIENT_CLINIC_OR_DEPARTMENT_OTHER): Payer: Medicare HMO | Admitting: Rehabilitative and Restorative Service Providers"

## 2021-09-05 ENCOUNTER — Encounter (HOSPITAL_BASED_OUTPATIENT_CLINIC_OR_DEPARTMENT_OTHER): Payer: Self-pay | Admitting: Internal Medicine

## 2021-09-06 ENCOUNTER — Ambulatory Visit (HOSPITAL_BASED_OUTPATIENT_CLINIC_OR_DEPARTMENT_OTHER): Payer: Self-pay | Admitting: Registered Nurse

## 2021-09-06 ENCOUNTER — Other Ambulatory Visit: Payer: Self-pay

## 2021-09-06 ENCOUNTER — Ambulatory Visit
Payer: Medicare HMO | Attending: Rehabilitative and Restorative Service Providers" | Admitting: Rehabilitative and Restorative Service Providers"

## 2021-09-06 DIAGNOSIS — M25562 Pain in left knee: Secondary | ICD-10-CM | POA: Diagnosis present

## 2021-09-06 DIAGNOSIS — S46012S Strain of muscle(s) and tendon(s) of the rotator cuff of left shoulder, sequela: Secondary | ICD-10-CM | POA: Insufficient documentation

## 2021-09-06 DIAGNOSIS — M25552 Pain in left hip: Secondary | ICD-10-CM | POA: Diagnosis present

## 2021-09-06 DIAGNOSIS — M25512 Pain in left shoulder: Secondary | ICD-10-CM | POA: Diagnosis present

## 2021-09-06 NOTE — Progress Notes (Signed)
OUTPATIENT PHYSICAL THERAPY Discharge Note    Referring Physician: Nicholas Lose, MD  Date of Onset: 06/2021    Subjective: Pt is a 66 y.o. female who presents to Physical Therapy with sub-acute on chronic L shoulder pain that is not improving. States that she does not remember any specific incident that precipitated the pain. States that the symptoms are described as a dull ache that is mild to moderate in nature. Symptoms stay local to the L shoulder. Denies any numbness or tingling symptoms. Reports occasionally feeling weakness in her L hand. States that she currently has difficulty sleeping, reaching overhead, and reaching out to the side.       09/06/2021: Pt states that she no longer has shoulder pain. Reports doing her home exercises but not as prescribed. Pt wishes to discontinue Physical Therapy as she no longer has pain.     Pre-morbid Functional Level: WFL    Occupation: Retired  Dressing/Grooming: mildly impaired  Driving: Normal  Sleeping: mildly impaired      Contraindications/Precautions: GAD, hx of frozen shoulder    Patient Active Problem List:     Elevated BP without diagnosis of hypertension     Snoring     Toenail deformity     Insomnia     Major depressive disorder, recurrent, mild (HCC)     GAD (generalized anxiety disorder)     Other hyperlipidemia     Right knee pain     Stiffness of hand joint     Neck pain     Left knee pain     Left hip pain     Left shoulder pain    meloxicam (MOBIC) 7.5 MG tablet, TAKE 1 TABLET BY MOUTH EVERY DAY AS NEEDED FOR PAIN, Disp: 30 tablet, Rfl: 0  atorvastatin (LIPITOR) 20 MG tablet, Take 1 tablet by mouth in the morning., Disp: 90 tablet, Rfl: 1  hydrOXYzine (ATARAX) 10 MG tablet, Take 1 or 2 pills every 8 hours as needed for anxiety, Disp: 30 tablet, Rfl: 0  escitalopram (LEXAPRO) 20 MG tablet, TAKE 1 TABLET BY MOUTH EVERY DAY, Disp: 90 tablet, Rfl: 0  Multiple Vitamins-Minerals (THERA-M) TABS, Take 1 tablet by mouth in the morning., Disp: , Rfl:   ascorbic  acid (VITAMIN C) 500 MG tablet, Take 500 mg by mouth in the morning., Disp: , Rfl:   VITAMIN D PO, Take 600 mg by mouth daily, Disp: , Rfl:     No current facility-administered medications on file prior to visit.      Mental Status/Communication: normal  Learns Best: practice  Primary Language: english; Requires Interpreter: no     Objective:  Please Note: Only populated fields were assessed by provider, fields left blank were not assessed.     LEFT A/PROM RIGHT A/PROM   CERVICAL       FLEX WNL      EXT WNL      SB       ROT WNL WNL   SHOULDER       FLEX 180  180     ABD 180  180     IR 80 90     ER 80 90   ELBOW       FLEX       EXT        LEFT STRENGTH RIGHT STRENGTH   SHOULDER       FLEX 4+/5 5-/5     ABD 4+/5  5-/5     IR 5-/5  5-/5     ER 4/5 4+/5   ELBOW       FLEX       EXT     SCAPULAR       EXT       MID TRAP       LOW TRAP       Posture: Forward head posture, bilateral rounded shoulders    Special Tests:     Painful arc sign: -R, +L  Supraspinatus test: -R, +L  Infraspinatus test: -R, +L  Hawkins kennedy: -R, -L  Speeds: -R, -L  Neers: -R, +L    Physical Therapy Plan of Care    MD: Jodean Lima., MD  Referring Provider: Fuller Song, MD  Diagnosis: L RTC strain    Assessment/Objective Findings:   Patient is a 66 year old female with complaints of sub-acute on chronic L shoulder pain that is not improving. Pt presents with decreased gross L shoulder strength, + painful arc sign, +supraspinatus test, and + infraspinatus test. This is contributing to her inability to tolerate sleeping, reaching overhead, and reaching out to the side. Physical Therapy is recommended in order to address these functional impairments and allow her to return to her prior level of function.     09/06/2021: Pt no longer has shoulder pain. Her gross L shoulder strength and functional strength has improved since starting Physical Therapy. Pt wishes to discontinue Physical Therapy as she no longer has pain.         Short Term Goals:  4 weeks  Pt will increase gross L shoulder strength by 1/3 MMT--MET  Pt will be able to tolerate reaching overhead--MET  Pt will be independent in HEP--MET  Pt will be able to tolerate sleeping on her side--MET  Long Term Goals: 8 weeks  Same as STG    Plan:    D/C with [facility], PT, Lic # [id number]

## 2021-09-06 NOTE — Progress Notes (Signed)
09/06/21 [place] Discipline PT   Visit   Visit number 2   POC Due date 09/02/2021   Manual Therapy   Technique PROM assessment 3 min   Ther Exercise   Exercise reviewed HEP   Ther Exercise 2   Exercise re-assessment   Patient Education   What was taught? HEP; side lying shoulder ER, side lying shoulder horizontal abduction, side lying shoulder abduction   Method Verbal;Demo;Practice;Written   Patient comprehension Yes       Marco Collie, PT, Lic # [id number]

## 2021-09-06 NOTE — Telephone Encounter (Signed)
Reason for Disposition  . Patient wants to be seen    Answer Assessment - Initial Assessment Questions  Elouise Munroe, RN, 09/06/2021  TC to pt for f/u to MyChart message.  Reports a sudden onset of a 1 1/2 - 2 wk hx of problems falling asleep and staying asleep.  May sleep a more restful night of about 6 hours here and there, but rarely.  No other associated sx.  Denies increase is stressors. No new meds/foods.   Only has 1 cup of coffee per day and little chocolate.  Has been trying otc Melatonin and herbal teas.  Tried walking outside before bed, but stopped as d/t lack of sleep worried may be "delirious".  Only meds she is taking is Lexapro. Has been taking this for 7 years or so.    Protocols used: ADULT INSOMNIA-A-[place]

## 2021-09-07 ENCOUNTER — Encounter (HOSPITAL_BASED_OUTPATIENT_CLINIC_OR_DEPARTMENT_OTHER): Payer: Self-pay | Admitting: Internal Medicine

## 2021-09-07 ENCOUNTER — Ambulatory Visit: Payer: Medicare HMO | Attending: Internal Medicine | Admitting: Internal Medicine

## 2021-09-07 ENCOUNTER — Other Ambulatory Visit: Payer: Self-pay

## 2021-09-07 VITALS — BP 132/79 | HR 70 | Temp 97.0°F | Ht 67.0 in | Wt 173.5 lb

## 2021-09-07 DIAGNOSIS — G4700 Insomnia, unspecified: Secondary | ICD-10-CM | POA: Insufficient documentation

## 2021-09-07 DIAGNOSIS — F411 Generalized anxiety disorder: Secondary | ICD-10-CM | POA: Diagnosis present

## 2021-09-07 MED ORDER — TRAZODONE HCL 50 MG PO TABS
50.0000 mg | ORAL_TABLET | Freq: Every evening | ORAL | 0 refills | Status: DC
Start: 2021-09-07 — End: 2021-09-21

## 2021-09-07 NOTE — Patient Instructions (Signed)
Try to avoid using the phone or TV 1 hour before bedtime, keep the room dark and quiet. Avoid afternoon and nighttime caffeine.

## 2021-09-07 NOTE — Progress Notes (Signed)
RESIDENT PRIMARY CARE NOTE       SUBJECTIVE  Denise Gutierrez is a 66 year old English-speaking female here for:    #Insomnia  -sleeping 2-3 hours a night  -has a hard time falling asleep and staying asleep  -trying to exercise, which is not helping  -drinks 1 cup of coffee in the morning, drinks camomile tea at night  -no smoking or nicotine use  -has a beer every 2-3 days  -tried melatonin without working  -no history of sleep apnea, has been told that she snores  -feels that her mind is racing, worrying about trying to sleep, tries to not look at the time  -has a phone in bed, has been watching tv at night  -no extra stressors in life, feels anxious but this is baseline        Review of Systems   Constitutional: Positive for malaise/fatigue. Negative for chills, fever and weight loss.   Respiratory: Negative for shortness of breath.    Cardiovascular: Negative for chest pain.   Psychiatric/Behavioral: The patient is nervous/anxious and has insomnia.     -     Active Medical Issues:  Patient Active Problem List:     Elevated BP without diagnosis of hypertension     Snoring     Toenail deformity     Insomnia     Major depressive disorder, recurrent, mild (HCC)     GAD (generalized anxiety disorder)     Other hyperlipidemia     Right knee pain     Stiffness of hand joint     Neck pain     Left knee pain     Left hip pain     Left shoulder pain      Past medical, surgical, and social history reviewed.       OBJECTIVE  BP 138/89   Pulse 70   Temp 97 F (36.1 C) (Temporal)   Ht 5\' 7"  (1.702 m)   Wt 78.7 kg (173 lb 8 oz)   SpO2 98%   BMI 27.17 kg/m   Most Recent Weight Reading(s)  09/07/21 : 78.7 kg (173 lb 8 oz)  07/13/21 : 78 kg (172 lb)  11/24/20 : 76.7 kg (169 lb)  06/17/20 : 77.1 kg (170 lb)  06/02/20 : 77.1 kg (170 lb)      Most Recent BP Reading(s)  09/07/21 : 138/89  07/13/21 : 128/78  11/24/20 : 132/80  06/17/20 : 135/86  06/02/20 : 136/84      Physical Exam  Constitutional:       General: She is not in acute  distress.     Appearance: Normal appearance. She is normal weight. She is not ill-appearing, toxic-appearing or diaphoretic.   HENT:      Head: Normocephalic and atraumatic.   Cardiovascular:      Rate and Rhythm: Normal rate and regular rhythm.      Pulses: Normal pulses.      Heart sounds: Normal heart sounds. No murmur heard.     No friction rub. No gallop.   Pulmonary:      Effort: Pulmonary effort is normal. No respiratory distress.      Breath sounds: Normal breath sounds. No stridor. No wheezing or rales.   Neurological:      Mental Status: She is alert.       She has also been you know try to get some more exercise    I personally reviewed relevant labs, imaging, and studies  in [place].       ASSESSMENT AND PLAN  Denise Gutierrez is a 66 year old female who presents with 1 week of insomnia.     (G47.00) Insomnia, unspecified type  Went over sleep hygiene, including avoiding electronics 1 hour prior to bedtime, avoiding caffeine and alcohol.  She has been prescribed hydroxyzine 10 mg tablets in the past.  Recommended a trial of hydroxyzine 20 mg nightly to see if this improves sleep.  If no improvement on the hydroxyzine can stop using hydroxyzine and switch to trazodone 50 mg nightly.  -traZODone (DESYREL) 50 MG tablet            Plan for televisit follow-up next week for insomnia.    ____________    Discussed with Attending Dr. Aura Dials, MD, 09/07/2021    Resident Physician

## 2021-09-09 NOTE — Progress Notes (Addendum)
PRECEPTOR NOTE  On the day of the patient's visit, I personally saw and evaluated the patient. In addition, I reviewed findings with Dr. Tiburcio Pea  I confirm the key elements of history and physical exam as described in resident's note.  I agree with the assessment and plan as described below.  Anxiety - also given information for [facility]  Please see resident's note for further details.

## 2021-09-13 ENCOUNTER — Other Ambulatory Visit (HOSPITAL_BASED_OUTPATIENT_CLINIC_OR_DEPARTMENT_OTHER): Payer: Self-pay | Admitting: Internal Medicine

## 2021-09-13 ENCOUNTER — Ambulatory Visit (HOSPITAL_BASED_OUTPATIENT_CLINIC_OR_DEPARTMENT_OTHER): Payer: Medicare HMO | Admitting: Rehabilitative and Restorative Service Providers"

## 2021-09-13 ENCOUNTER — Ambulatory Visit: Payer: Medicare HMO | Admitting: Internal Medicine

## 2021-09-13 DIAGNOSIS — M25562 Pain in left knee: Secondary | ICD-10-CM

## 2021-09-13 DIAGNOSIS — M25512 Pain in left shoulder: Secondary | ICD-10-CM

## 2021-09-13 DIAGNOSIS — M25552 Pain in left hip: Secondary | ICD-10-CM

## 2021-09-13 DIAGNOSIS — F5101 Primary insomnia: Secondary | ICD-10-CM | POA: Diagnosis not present

## 2021-09-13 NOTE — Progress Notes (Signed)
RESIDENT PRIMARY CARE TELEVISIT NOTE    Denise Gutierrez is a English-speaking 66 year old female patient with the following concerns:    Visit conducted via phone [phone]    #Insomnia  Has been taking the hydroxyzine 20mg  at bedtime which has not helped her sleeping for the past 4 days. Has tried to cut back on electronics in the bedroom. Has picked up the trazodone but has not used it yet.      ROS - Pertinent symptoms as noted above, other ROS negative    Active Medical Issues:  Patient Active Problem List:     Elevated BP without diagnosis of hypertension     Snoring     Toenail deformity     Insomnia     Major depressive disorder, recurrent, mild (HCC)     GAD (generalized anxiety disorder)     Other hyperlipidemia     Right knee pain     Stiffness of hand joint     Neck pain     Left knee pain     Left hip pain     Left shoulder pain      Allergies, medications, and other relevant history reviewed in [facility].    Physical Exam: limited as visit was conducted virtually.  No acute distress.  Speaking full sentences without difficulty.       Assessment/Plan:  Denise Gutierrez is a 66 year old female who presents with insomnia.    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 Went over sleep hygiene. She trialled hydroxyzine without improvement for the past 4 days. She will trial trazodone 50mg  nightly.    If no improvement after 1 week should follow up with her PCP.       Discussed with Attending Dr. 7/17, MD, 09/13/2021  Resident Physician, PGY2

## 2021-09-13 NOTE — Telephone Encounter (Signed)
PER Pharmacy, Denise Gutierrez is a 65 year old female has requested a refill of      -  meloxicam (MOBIC) 7.5 MG tablet           Last Office Visit: 07/13/2021 with Ferd Glassing., MD   Last Physical Exam: 01/05/2020     There are no preventive care reminders to display for this patient.     Other Med Adult:  Most Recent BP Reading(s)  09/07/21 : 132/79        Cholesterol (mg/dL)   Date Value   18/29/9371 259 (H)     LOW DENSITY LIPOPROTEIN DIRECT (mg/dL)   Date Value   69/67/8938 180     HIGH DENSITY LIPOPROTEIN (mg/dL)   Date Value   01/09/5101 62 (H)     TRIGLYCERIDES (mg/dL)   Date Value   58/52/7782 139         THYROID SCREEN TSH REFLEX FT4 (uIU/mL)   Date Value   11/24/2020 1.650         TSH (THYROID STIM HORMONE) (uIU/mL)   Date Value   07/05/2021 2.430       HEMOGLOBIN A1C (%)   Date Value   07/05/2021 5.6       No results found for: POCA1C      INR (no units)   Date Value   07/05/2021 1.0   03/13/2021 1.1     INR CARE EVERYWHERE (no units)   Date Value   10/22/2017 1.04       SODIUM (mmol/L)   Date Value   07/05/2021 143       POTASSIUM (mmol/L)   Date Value   07/05/2021 4.5           CREATININE (mg/dL)   Date Value   42/35/3614 0.8        Documented patient preferred pharmacies:    [id number] [facility], [place] - [address]  Phone: [phone] Fax: [phone]

## 2021-09-18 ENCOUNTER — Encounter (HOSPITAL_BASED_OUTPATIENT_CLINIC_OR_DEPARTMENT_OTHER): Payer: Self-pay | Admitting: Internal Medicine

## 2021-09-19 ENCOUNTER — Encounter (HOSPITAL_BASED_OUTPATIENT_CLINIC_OR_DEPARTMENT_OTHER): Payer: Self-pay | Admitting: Internal Medicine

## 2021-09-19 NOTE — Progress Notes (Signed)
PRECEPTOR NOTE  On the day of the patient's visit.  No in-person exam or evaluation was done due to COVID-19 pandemic. In addition, I reviewed the history with the resident.  I confirm the key elements of history as described in resident's note.  I agree with the assessment and plan as described above.  Please see resident's note for further details.

## 2021-09-20 ENCOUNTER — Ambulatory Visit (HOSPITAL_BASED_OUTPATIENT_CLINIC_OR_DEPARTMENT_OTHER): Payer: Self-pay

## 2021-09-20 ENCOUNTER — Other Ambulatory Visit: Payer: Self-pay

## 2021-09-20 ENCOUNTER — Ambulatory Visit: Payer: Medicare HMO | Attending: Family Medicine | Admitting: Family Medicine

## 2021-09-20 DIAGNOSIS — Z006 Encounter for examination for normal comparison and control in clinical research program: Secondary | ICD-10-CM

## 2021-09-20 LAB — CBC, PLATELET & DIFFERENTIAL
ABSOLUTE BASO COUNT: 0.1 10*3/uL (ref 0.0–0.1)
ABSOLUTE EOSINOPHIL COUNT: 0.5 10*3/uL (ref 0.0–0.8)
ABSOLUTE IMM GRAN COUNT: 0.04 10*3/uL (ref 0.00–0.10)
ABSOLUTE LYMPH COUNT: 2 10*3/uL (ref 0.6–5.9)
ABSOLUTE MONO COUNT: 0.5 10*3/uL (ref 0.2–1.4)
ABSOLUTE NEUTROPHIL COUNT: 4.1 10*3/uL (ref 1.6–8.3)
ABSOLUTE NRBC COUNT: 0 10*3/uL (ref 0.0–0.0)
BASOPHIL %: 1.3 % — ABNORMAL HIGH (ref 0.0–1.2)
EOSINOPHIL %: 6.5 % (ref 0.0–7.0)
HEMATOCRIT: 46.3 % — ABNORMAL HIGH (ref 34.1–44.9)
HEMOGLOBIN: 14.4 g/dL (ref 11.2–15.7)
IMMATURE GRANULOCYTE %: 0.6 % (ref 0.0–1.0)
LYMPHOCYTE %: 27.6 % (ref 15.0–54.0)
MEAN CORP HGB CONC: 31.1 g/dL (ref 31.0–37.0)
MEAN CORPUSCULAR HGB: 27.2 pg (ref 26.0–34.0)
MEAN CORPUSCULAR VOL: 87.4 fl (ref 80.0–100.0)
MEAN PLATELET VOLUME: 12 fL (ref 8.7–12.5)
MONOCYTE %: 6.9 % (ref 4.0–13.0)
NEUTROPHIL %: 57.1 % (ref 40.0–75.0)
NRBC %: 0 % (ref 0.0–0.0)
PLATELET COUNT: 307 10*3/uL (ref 150–400)
RBC DISTRIBUTION WIDTH STD DEV: 45 fL (ref 35.1–46.3)
RED BLOOD CELL COUNT: 5.3 M/uL — ABNORMAL HIGH (ref 3.90–5.20)
WHITE BLOOD CELL COUNT: 7.1 10*3/uL (ref 4.0–11.0)

## 2021-09-20 LAB — TSH (THYROID STIMULATING HORMONE): TSH (THYROID STIM HORMONE): 2.94 u[IU]/mL (ref 0.270–4.200)

## 2021-09-20 LAB — COMPREHENSIVE METABOLIC PANEL
ALANINE AMINOTRANSFERASE: 20 U/L (ref 12–45)
ALBUMIN: 4.5 g/dL (ref 3.4–5.2)
ALKALINE PHOSPHATASE: 116 U/L (ref 45–117)
ANION GAP: 11 mmol/L (ref 10–22)
ASPARTATE AMINOTRANSFERASE: 20 U/L (ref 8–34)
BILIRUBIN TOTAL: 0.4 mg/dL (ref 0.2–1.0)
BUN (UREA NITROGEN): 18 mg/dL (ref 7–18)
CALCIUM: 10.2 mg/dL (ref 8.5–10.5)
CARBON DIOXIDE: 24 mmol/L (ref 21–32)
CHLORIDE: 104 mmol/L (ref 98–107)
CREATININE: 0.7 mg/dL (ref 0.4–1.2)
ESTIMATED GLOMERULAR FILT RATE: >60 mL/min (ref >60)
Glucose Random: 103 mg/dL (ref 74–160)
POTASSIUM: 4.8 mmol/L (ref 3.5–5.1)
SODIUM: 139 mmol/L (ref 136–145)
TOTAL PROTEIN: 6.9 g/dL (ref 6.4–8.2)

## 2021-09-20 LAB — PROTHROMBIN TIME
INR: 1 (ref 2.0–3.5)
PROTHROMBIN TIME: 11 SECONDS (ref 9.6–12.3)

## 2021-09-20 LAB — BILIRUBIN DIRECT: BILIRUBIN DIRECT: <0.2 mg/dL (ref 0.0–0.2)

## 2021-09-20 LAB — TROPONIN T HS RANDOM: TROPONIN T HS RANDOM: 10 ng/L (ref 0–10)

## 2021-09-20 LAB — CYSTATIN C
CYSTATIN C GFR: >60 mL/min (ref >60)
CYSTATIN C: 0.93 mg/L (ref 0.62–1.16)

## 2021-09-20 LAB — URINALYSIS
BILIRUBIN, URINE: NEGATIVE
GLUCOSE, URINE: NEGATIVE MG/DL
KETONE, URINE: NEGATIVE MG/DL
LEUKOCYTE ESTERASE: NEGATIVE
NITRITE, URINE: NEGATIVE
OCCULT BLOOD, URINE: NEGATIVE
PH URINE: 5 (ref 5.0–8.0)
PROTEIN, URINE: NEGATIVE MG/DL
SPECIFIC GRAVITY URINE: 1.025 (ref 1.003–1.035)

## 2021-09-20 LAB — FREE THYROXINE: FREE THYROXINE: 1.15 ng/dL (ref 0.93–1.70)

## 2021-09-20 LAB — LIPID PANEL
Cholesterol: 267 mg/dL — ABNORMAL HIGH (ref 0–239)
HIGH DENSITY LIPOPROTEIN: 64 mg/dL — ABNORMAL HIGH (ref 40–60)
LOW DENSITY LIPOPROTEIN DIRECT: 195 mg/dL — ABNORMAL HIGH (ref 0–189)
TRIGLYCERIDES: 142 mg/dL (ref 0–150)

## 2021-09-20 LAB — NT-PROBNP: NT-proBNP: 57 pg/mL (ref 0–125)

## 2021-09-20 LAB — MICROALBUMIN RANDOM URINE
ALBUMIN URINE RANDOM: <1.2 mg/dL (ref 0.0–1.9)
CREATININE RANDOM URINE: 111 mg/dL (ref 28–217)

## 2021-09-20 LAB — HEMOGLOBIN A1C
ESTIMATED AVERAGE GLUCOSE: 117 mg/dL (ref 74–160)
HEMOGLOBIN A1C: 5.7 % — ABNORMAL HIGH (ref 4.0–5.6)

## 2021-09-20 LAB — VITAMIN D,25 HYDROXY: VITAMIN D,25 HYDROXY: 41 ng/mL (ref 30.0–100.0)

## 2021-09-20 LAB — D-DIMER PE/DVT, QUANTITATIVE: D-DIMER PE/DVT, QUANTITATIVE: 1924 ng/mLFEU (ref 0.00–499)

## 2021-09-20 LAB — APTT: APTT: 32 SECONDS (ref 26.9–38.0)

## 2021-09-20 LAB — C-REACTIVE PROTEIN HIGH SENS: C-REACTIVE PROTEIN HIGH SENS: 1 mg/L (ref 0–3)

## 2021-09-20 NOTE — Progress Notes (Unsigned)
Denise Gutierrez is a 66 year old female English speaking patient of Dr .Cheron Every presents for a virtual visit to discuss sleep concerns.     TV with Dr. Tiburcio Pea on 09/13/21 to discuss same symptoms:    #Insomnia  Has been taking the hydroxyzine 20mg  at bedtime which has not helped her sleeping for the past 4 days. Has tried to cut back on electronics in the bedroom. Has picked up the trazodone but has not used it yet.      Assessment/Plan:  Denise Gutierrez is a 66 year old female who presents with insomnia.    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 Went over sleep hygiene. She trialled hydroxyzine without improvement for the past 4 days. She will trial trazodone 50mg  nightly.    If no improvement after 1 week should follow up with her PCP.     RN triage 09/20/21:    Answer Assessment - Initial Assessment Questions  sleep issues worsening the last 3 weeks.   Hardest time falling asleep.   Has tried stopping screen time, reading, tea, melatonin.  Tries to go to bed ex: 10:30pm and will be up till 2am.   No new stresses, life changes.   Drinks 1 cup coffee every AM 11.   Sometimes ETOH but has stopped to try and improve sleep. No substances.   Has been on hydroxyzine, not helping.  In past has been on , it worked well. Has only needed it one or two times, occasionally.    Currently patient states history as above, 3 weeks ago- denies any trigger, has happened in the past but never last this long, has tried melatonin, sleepy time tea, hydroxyzine and trazodone all with no relief. Takes "hours" to fall asleep, sleeps 2-3 hours and then up every hour until she wakes up in the am. States this has happened 10 times over her lifetime and typically needs a "reset" and then sleeps fine, 09/22/21 has worked in the past and patient is requesting a prescription today, states she has never taken daily and denies dependence.     Allergies:  Review of Patient's Allergies indicates:   Contrast dye [iopam*    Hives     Comment:MRI    meloxicam (MOBIC) 7.5 MG tablet, TAKE 1 TABLET BY MOUTH EVERY DAY AS NEEDED FOR PAIN, Disp: 30 tablet, Rfl: 0  traZODone (DESYREL) 50 MG tablet, Take 1 tablet by mouth nightly, Disp: 30 tablet, Rfl: 0  atorvastatin (LIPITOR) 20 MG tablet, Take 1 tablet by mouth in the morning., Disp: 90 tablet, Rfl: 1  hydrOXYzine (ATARAX) 10 MG tablet, Take 1 or 2 pills every 8 hours as needed for anxiety, Disp: 30 tablet, Rfl: 0  escitalopram (LEXAPRO) 20 MG tablet, TAKE 1 TABLET BY MOUTH EVERY DAY, Disp: 90 tablet, Rfl: 0  Multiple Vitamins-Minerals (THERA-M) TABS, Take 1 tablet by mouth in the morning., Disp: , Rfl:   ascorbic acid (VITAMIN C) 500 MG tablet, Take 500 mg by mouth in the morning., Disp: , Rfl:   VITAMIN D PO, Take 600 mg by mouth daily, Disp: , Rfl:     No current facility-administered medications on file prior to visit.    Objective:    Physical exam:  Unable to visually assess patient during telephone televisit. Patient is calm and in no apparent distress. Able to speak in full sentences.     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  The patient expresses knowledge and understanding of medications including possible side-effects and barriers. No barriers to adherence were identified.      *Medications and allergies are reviewed at this visit.  If medication changes were  made during this visit, the patient was given a new medication sheet.     Lorelle Formosa, APRN, 09/20/2021

## 2021-09-20 NOTE — Telephone Encounter (Signed)
Reason for Disposition  . Insomnia persists > 1 week and following Insomnia Care Advice    Answer Assessment - Initial Assessment Questions  sleep issues worsening the last 3 weeks.   Hardest time falling asleep.   Has tried stopping screen time, reading, tea, melatonin.  Tries to go to bed ex: 10:30pm and will be up till 2am.   No new stresses, life changes.   Drinks 1 cup coffee every AM 11.   Sometimes ETOH but has stopped to try and improve sleep. No substances.   Has been on hydroxyzine, not helping.  In past has been on [place], it worked well. Has only needed it one or two times, occasionally.    Protocols used: ADULT INSOMNIA-A-OH    Priority: Routine    Advised per nursing triage protocol.  Verbalized understanding and agreement with instructions and disposition.     Recommended disposition for patient:Disposition: See in Office within 3 days    Instructed patient to call back for any new, worsening, or worrisome symptoms or concerns any time day or night.

## 2021-09-21 ENCOUNTER — Ambulatory Visit: Payer: Medicare HMO | Attending: Internal Medicine | Admitting: Registered Nurse

## 2021-09-21 ENCOUNTER — Other Ambulatory Visit (HOSPITAL_BASED_OUTPATIENT_CLINIC_OR_DEPARTMENT_OTHER): Payer: Self-pay | Admitting: Internal Medicine

## 2021-09-21 DIAGNOSIS — F3340 Major depressive disorder, recurrent, in remission, unspecified: Secondary | ICD-10-CM

## 2021-09-21 DIAGNOSIS — F5101 Primary insomnia: Secondary | ICD-10-CM | POA: Insufficient documentation

## 2021-09-21 DIAGNOSIS — F419 Anxiety disorder, unspecified: Secondary | ICD-10-CM

## 2021-09-21 LAB — SARS-COV-2 SPIKE ANTIBODY
SARS-CoV-2 SPIKE AB DILUTION: 6086 U/mL (ref <0.8)
SARS-CoV-2 SPIKE AB INTERP: POSITIVE

## 2021-09-21 MED ORDER — ZOLPIDEM TARTRATE 5 MG PO TABS
5.00 mg | ORAL_TABLET | Freq: Every evening | ORAL | 0 refills | Status: AC | PRN
Start: 2021-09-21 — End: 2021-10-21

## 2021-09-21 NOTE — Telephone Encounter (Signed)
PER Pharmacy, Denise Gutierrez is a 66 year old female has requested a refill of  - lexapro    Last Office Visit: 07/13/21 with PCP    Last Physical Exam: 01/05/20  There are no preventive care reminders to display for this patient.  Other Med Adult:  Most Recent BP Reading(s)  09/07/21 : 132/79        Cholesterol (mg/dL)   Date Value   16/12/9602 267 (H)     LOW DENSITY LIPOPROTEIN DIRECT (mg/dL)   Date Value   54/11/8117 195 (H)     HIGH DENSITY LIPOPROTEIN (mg/dL)   Date Value   14/78/2956 64 (H)     TRIGLYCERIDES (mg/dL)   Date Value   21/30/8657 142         THYROID SCREEN TSH REFLEX FT4 (uIU/mL)   Date Value   11/24/2020 1.650         TSH (THYROID STIM HORMONE) (uIU/mL)   Date Value   09/20/2021 2.940       HEMOGLOBIN A1C (%)   Date Value   09/20/2021 5.7 (H)       No results found for: POCA1C      INR (no units)   Date Value   09/20/2021 1.0   07/05/2021 1.0   03/13/2021 1.1       SODIUM (mmol/L)   Date Value   09/20/2021 139       POTASSIUM (mmol/L)   Date Value   09/20/2021 4.8           CREATININE (mg/dL)   Date Value   84/69/6295 0.7     Documented patient preferred pharmacies:    [id number], [place] - [address]  Phone: [phone] Fax: [phone]    [facility], [place], [place] - [address]  Phone: [phone] Fax: [phone]

## 2021-09-28 ENCOUNTER — Encounter (HOSPITAL_BASED_OUTPATIENT_CLINIC_OR_DEPARTMENT_OTHER): Payer: Self-pay | Admitting: Family Medicine

## 2021-10-02 ENCOUNTER — Telehealth (HOSPITAL_BASED_OUTPATIENT_CLINIC_OR_DEPARTMENT_OTHER): Payer: Self-pay | Admitting: Internal Medicine

## 2021-10-02 NOTE — Telephone Encounter (Signed)
Med has been discontinued on 09/07/2021 by Dr. Lorelle Formosa.

## 2021-10-02 NOTE — Telephone Encounter (Signed)
-----   Message from [facility] sent at 10/02/2021 10:17 AM EDT -----  Regarding: 90 Day RXR Request  [facility]    Person calling on behalf of patient: Pharmacy    Medicine Name: traZODone (DESYREL) 50 MG tablet     Documented patient preferred pharmacies:   [id number], [place] - [address]  Phone: [phone] Fax: [phone]

## 2021-12-17 ENCOUNTER — Other Ambulatory Visit (HOSPITAL_BASED_OUTPATIENT_CLINIC_OR_DEPARTMENT_OTHER): Payer: Self-pay | Admitting: Internal Medicine

## 2021-12-17 DIAGNOSIS — F3340 Major depressive disorder, recurrent, in remission, unspecified: Secondary | ICD-10-CM

## 2021-12-17 DIAGNOSIS — F419 Anxiety disorder, unspecified: Secondary | ICD-10-CM

## 2021-12-17 NOTE — Telephone Encounter (Signed)
PER Pharmacy, Denise Gutierrez is a 66 year old female has requested a refill of escitalopram 20.      Last Office Visit: 09-21-21 with Belt schlesinger  Last Physical Exam: 01-05-20    There are no preventive care reminders to display for this patient.    Other Med Adult:  Most Recent BP Reading(s)  09/07/21 : 132/79        Cholesterol (mg/dL)   Date Value   09/20/2021 267 (H)     LOW DENSITY LIPOPROTEIN DIRECT (mg/dL)   Date Value   09/20/2021 195 (H)     HIGH DENSITY LIPOPROTEIN (mg/dL)   Date Value   09/20/2021 64 (H)     TRIGLYCERIDES (mg/dL)   Date Value   09/20/2021 142         THYROID SCREEN TSH REFLEX FT4 (uIU/mL)   Date Value   11/24/2020 1.650         TSH (THYROID STIM HORMONE) (uIU/mL)   Date Value   09/20/2021 2.940       HEMOGLOBIN A1C (%)   Date Value   09/20/2021 5.7 (H)       No results found for: POCA1C      INR (no units)   Date Value   09/20/2021 1.0   07/05/2021 1.0   03/13/2021 1.1       SODIUM (mmol/L)   Date Value   09/20/2021 139       POTASSIUM (mmol/L)   Date Value   09/20/2021 4.8           CREATININE (mg/dL)   Date Value   09/20/2021 0.7       Documented patient preferred pharmacies:    [id number] - [place], [place] [address]  Phone: [phone] Fax: [phone]

## 2022-01-04 NOTE — Progress Notes (Signed)
Denise Gutierrez is a 66 year old female English speaking patient presents to clinic with multiple concerns.      Bump to right lower leg, fell >1 year ago, hit area just below knee, healed fine but now left with a bump, soft, only tender when kneeling on knee/leg, otherwise no pain, no redness, no warm, no bruising.     Left foot, middle toenail wont grow, painful and now discolored, would like to see Podiatry for possible removal of nail, other nails not effected.     1 week of epigastric pain/pressure after eating, some sour taste to mouth, worse when laying down. With known GERD in the past, not taking any OTC meds for current symptoms.     Denies fever, chills, night sweats, fatigue, weight loss, HA, dizziness, n/v/d, acute abd pain, SOB or chest pain.     Most Recent BP Reading(s)  01/05/22 : 139/87  09/07/21 : 132/79  07/13/21 : 128/78  11/24/20 : 132/80  06/17/20 : 135/86    Most Recent Weight Reading(s)  09/07/21 : 78.7 kg (173 lb 8 oz)  07/13/21 : 78 kg (172 lb)  11/24/20 : 76.7 kg (169 lb)  06/17/20 : 77.1 kg (170 lb)  06/02/20 : 77.1 kg (170 lb)    Allergies:  Review of Patient's Allergies indicates:   Contrast dye [iopam*    Hives    Comment:MRI    escitalopram (LEXAPRO) 20 MG tablet, TAKE 1 TABLET BY MOUTH EVERY DAY, Disp: 90 tablet, Rfl: 0  meloxicam (MOBIC) 7.5 MG tablet, TAKE 1 TABLET BY MOUTH EVERY DAY AS NEEDED FOR PAIN, Disp: 30 tablet, Rfl: 0  atorvastatin (LIPITOR) 20 MG tablet, Take 1 tablet by mouth in the morning., Disp: 90 tablet, Rfl: 1  Multiple Vitamins-Minerals (THERA-M) TABS, Take 1 tablet by mouth in the morning., Disp: , Rfl:   ascorbic acid (VITAMIN C) 500 MG tablet, Take 500 mg by mouth in the morning., Disp: , Rfl:   VITAMIN D PO, Take 600 mg by mouth daily, Disp: , Rfl:     No current facility-administered medications on file prior to visit.    Objective:    Constitutional: Patient appears, well developed, well nourished, in no acute distress, using appropriate eye contact and speaking  in full sentences.    HEENT: normocephalic, atraumatic  CV: Regular rate and rhythm,  S1/S2,  No murmurs, rubs or gallops.No JVD, no carotid bruit.   Resp: lungs clear to auscultation bilaterally. No respiratory distress  Abdomen: Soft, non-tender, non-distended, no rebound or guarding, no CVA tenderness. Normal bowel sounds in all 4 quadrants. No hepatosplenomegaly.    Skin: soft, moveable lump just below right knee, no pain with palpation, no surrounding erythema   Musculoskeletal: intact strength in all ext, ROM intact in all extremities. No joint swelling or erythema.   Psych: normal mood / behavior, pleasant    Assessment and plan:    (B35.1) Toenail fungus  Comment: left foot, middle toenail effected, will get Podiatry eval   Plan: REFERRAL TO PODIATRY (INT)        Referral entered     (R22.41) Lump of skin of right lower extremity  Comment: skin lump to just below right knee, upper shin, after injury 1 year ago, ?lingering hematoma, ?incidental lipoma, will get [place]  Plan: [place] SUBCUTANEOUS/SOFT TISSUE        Ordered    (Z23) COVID-19 vaccine administered  Comment: booster requested  Plan: IMMUNIZATION ADMIN SINGLE, MODERNA >12YO  COVID-19 FALL 2023        Given today in clinic     (R10.13) Abdominal pain, epigastric  Comment: 1 week epigastric pain with sour taste after eating, will trial famotidine  Plan: famotidine (PEPCID) 20 MG tablet        Rx sent, if symptoms persist or worsen f/u with PCP         *I have discussed the frequency, duration, and side-effects of  the prescribed medications.  The patient expresses knowledge and understanding of medications including possible side-effects and barriers. No barriers to adherence were identified.      *Medications and allergies are reviewed at this visit.  If medication changes were made during this visit, the patient was given a new medication sheet.     Dayton Martes, APRN, 01/04/2022                        01/05/2022  VIS given prior to  administration and reviewed with the patient and or legal guardian. Patient understands the disease and the vaccine. See immunization/Injection module or chart review for date of publication and additional information.  Dayton Martes, APRN

## 2022-01-05 ENCOUNTER — Encounter (HOSPITAL_BASED_OUTPATIENT_CLINIC_OR_DEPARTMENT_OTHER): Payer: Self-pay | Admitting: Registered Nurse

## 2022-01-05 ENCOUNTER — Ambulatory Visit: Payer: Medicare HMO | Attending: Internal Medicine | Admitting: Registered Nurse

## 2022-01-05 ENCOUNTER — Other Ambulatory Visit: Payer: Self-pay

## 2022-01-05 VITALS — BP 139/87 | HR 76

## 2022-01-05 DIAGNOSIS — R2241 Localized swelling, mass and lump, right lower limb: Secondary | ICD-10-CM | POA: Diagnosis present

## 2022-01-05 DIAGNOSIS — R1013 Epigastric pain: Secondary | ICD-10-CM

## 2022-01-05 DIAGNOSIS — B351 Tinea unguium: Secondary | ICD-10-CM | POA: Insufficient documentation

## 2022-01-05 DIAGNOSIS — Z23 Encounter for immunization: Secondary | ICD-10-CM | POA: Diagnosis present

## 2022-01-05 MED ORDER — FAMOTIDINE 20 MG PO TABS
20.0000 mg | ORAL_TABLET | Freq: Two times a day (BID) | ORAL | 0 refills | Status: DC
Start: 2022-01-05 — End: 2022-01-29

## 2022-01-05 NOTE — Progress Notes (Signed)
Covid-19 Vaccine Administration:  Moderna    Patient screened according to guidelines. Vaccine Information Fact Sheet given prior to administration and all patient/parent questions addressed. Covid-19 vaccine given without incident.  Possible side effects reviewed.  Return to clinic for next scheduled dose if indicated.      Para Cossey, LPN

## 2022-01-09 ENCOUNTER — Other Ambulatory Visit: Payer: Self-pay

## 2022-01-09 ENCOUNTER — Other Ambulatory Visit (HOSPITAL_BASED_OUTPATIENT_CLINIC_OR_DEPARTMENT_OTHER): Payer: Self-pay | Admitting: Registered Nurse

## 2022-01-09 ENCOUNTER — Ambulatory Visit
Admission: RE | Admit: 2022-01-09 | Discharge: 2022-01-09 | Disposition: A | Discharge status: Home / Self Care | Payer: Medicare HMO | Attending: Diagnostic Radiology | Admitting: Diagnostic Radiology

## 2022-01-09 DIAGNOSIS — M79661 Pain in right lower leg: Secondary | ICD-10-CM | POA: Diagnosis present

## 2022-01-09 DIAGNOSIS — R2241 Localized swelling, mass and lump, right lower limb: Secondary | ICD-10-CM | POA: Insufficient documentation

## 2022-01-09 DIAGNOSIS — Z23 Encounter for immunization: Secondary | ICD-10-CM

## 2022-01-09 DIAGNOSIS — B351 Tinea unguium: Secondary | ICD-10-CM

## 2022-01-09 DIAGNOSIS — R1013 Epigastric pain: Secondary | ICD-10-CM

## 2022-01-12 ENCOUNTER — Other Ambulatory Visit: Payer: Self-pay

## 2022-01-12 ENCOUNTER — Ambulatory Visit: Payer: Medicare HMO | Attending: Podiatrist | Admitting: Podiatrist

## 2022-01-12 DIAGNOSIS — M2042 Other hammer toe(s) (acquired), left foot: Secondary | ICD-10-CM | POA: Diagnosis present

## 2022-01-12 NOTE — Progress Notes (Signed)
Left 2nd toe flexor tenotomy

## 2022-01-12 NOTE — Patient Instructions (Signed)
Keep your bandage intact today  You will notice a small hold on the bottom of the toe, apply antibiotic ointment like neosporin or bacitracin until it heals over. You may shower in 24 hours.

## 2022-01-14 ENCOUNTER — Other Ambulatory Visit (HOSPITAL_BASED_OUTPATIENT_CLINIC_OR_DEPARTMENT_OTHER): Payer: Self-pay | Admitting: Internal Medicine

## 2022-01-14 DIAGNOSIS — E7849 Other hyperlipidemia: Secondary | ICD-10-CM

## 2022-01-15 NOTE — Telephone Encounter (Signed)
This prescription refill request has passed the Rx Renewal Authorization protocol.  This medication has been approved and sent to the patient's preferred pharmacy.      PER Pharmacy, Denise Gutierrez is a 66 year old female has requested a refill of atorvastatin.      Last Office Visit: 01/05/2022 with Lolita Rieger  Last Physical Exam: n/a    There are no preventive care reminders to display for this patient.    Statin Med:  Lipids   Cholesterol (mg/dL)   Date Value   09/20/2021 267 (H)     LOW DENSITY LIPOPROTEIN DIRECT (mg/dL)   Date Value   09/20/2021 195 (H)     HIGH DENSITY LIPOPROTEIN (mg/dL)   Date Value   09/20/2021 64 (H)     TRIGLYCERIDES (mg/dL)   Date Value   09/20/2021 142     LFTs   ALANINE AMINOTRANSFERASE (U/L)   Date Value   09/20/2021 20       ASPARTATE AMINOTRANSFERASE (U/L)   Date Value   09/20/2021 20       ALBUMIN (g/dL)   Date Value   09/20/2021 4.5       TOTAL PROTEIN (g/dL)   Date Value   09/20/2021 6.9       BILIRUBIN DIRECT (mg/dL)   Date Value   09/20/2021 < 0.2       BILIRUBIN TOTAL (mg/dL)   Date Value   09/20/2021 0.4       ALKALINE PHOSPHATASE (U/L)   Date Value   09/20/2021 116       Documented patient preferred pharmacies:    [id number] [place], [place] - [address]  Phone: [phone] Fax: [place], [place], [place] - [place]  Phone: [phone] Fax: [phone]

## 2022-01-19 ENCOUNTER — Ambulatory Visit: Payer: Medicare HMO | Attending: Podiatrist | Admitting: Podiatrist

## 2022-01-19 ENCOUNTER — Other Ambulatory Visit: Payer: Self-pay

## 2022-01-19 DIAGNOSIS — M2042 Other hammer toe(s) (acquired), left foot: Secondary | ICD-10-CM | POA: Insufficient documentation

## 2022-01-20 NOTE — Progress Notes (Signed)
HPI: Denise Gutierrez is a 66 year old who presents for follow up of her flexor tenotomy of her left 2nd toe. It has done well and she feels great. No concerns at this time.    ROS:  No F/C/N/V    Physical Exam:  Gen: Pleasant, NAD  Lower extremities: Pedal pulses are palpable and CFT < 3 sec all digits. The left 2nd digit is rectus today. It is nontender and there is no erythema, edema or discoloration. Wound is healed.      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 She will follow up as needed.

## 2022-01-25 ENCOUNTER — Telehealth (HOSPITAL_BASED_OUTPATIENT_CLINIC_OR_DEPARTMENT_OTHER): Payer: Self-pay | Admitting: Internal Medicine

## 2022-01-25 DIAGNOSIS — R2241 Localized swelling, mass and lump, right lower limb: Secondary | ICD-10-CM

## 2022-01-25 NOTE — Telephone Encounter (Signed)
Denise Martes, APRN  P [facility] Rn Pool  Pls see msg below, if patient wants removal I'll put in referral if not no follow up is indicated- thanks        Spoke with Pt.  Pt would like lump removed.

## 2022-01-29 ENCOUNTER — Other Ambulatory Visit (HOSPITAL_BASED_OUTPATIENT_CLINIC_OR_DEPARTMENT_OTHER): Payer: Self-pay | Admitting: Registered Nurse

## 2022-01-29 DIAGNOSIS — R1013 Epigastric pain: Secondary | ICD-10-CM

## 2022-01-29 MED ORDER — FAMOTIDINE 20 MG PO TABS
20.0000 mg | ORAL_TABLET | Freq: Two times a day (BID) | ORAL | 5 refills | Status: DC
Start: 2022-01-29 — End: 2022-06-21

## 2022-01-29 NOTE — Telephone Encounter (Signed)
PER Pharmacy, Denise Gutierrez is a 66 year old female has requested a refill of-  famotidine    Pharmacy is requesting a 90 days supply     Last Office Visit: 01/05/22 with schlesinger, e  Last Physical Exam: 01/05/20     There are no preventive care reminders to display for this patient.     Other Med Adult:  Most Recent BP Reading(s)  01/05/22 : 139/87        Cholesterol (mg/dL)   Date Value   09/20/2021 267 (H)     LOW DENSITY LIPOPROTEIN DIRECT (mg/dL)   Date Value   09/20/2021 195 (H)     HIGH DENSITY LIPOPROTEIN (mg/dL)   Date Value   09/20/2021 64 (H)     TRIGLYCERIDES (mg/dL)   Date Value   09/20/2021 142         THYROID SCREEN TSH REFLEX FT4 (uIU/mL)   Date Value   11/24/2020 1.650         TSH (THYROID STIM HORMONE) (uIU/mL)   Date Value   09/20/2021 2.940       HEMOGLOBIN A1C (%)   Date Value   09/20/2021 5.7 (H)       No results found for: POCA1C      INR (no units)   Date Value   09/20/2021 1.0   07/05/2021 1.0   03/13/2021 1.1       SODIUM (mmol/L)   Date Value   09/20/2021 139       POTASSIUM (mmol/L)   Date Value   09/20/2021 4.8           CREATININE (mg/dL)   Date Value   09/20/2021 0.7        Documented patient preferred pharmacies:    [id number] - [place], [place] [address]  Phone: [phone] Fax: [phone]

## 2022-02-08 ENCOUNTER — Ambulatory Visit: Payer: Medicare HMO | Attending: Surgery | Admitting: Surgery

## 2022-02-08 ENCOUNTER — Other Ambulatory Visit: Payer: Self-pay

## 2022-02-08 VITALS — BP 128/84 | HR 74 | Temp 97.6°F

## 2022-02-08 DIAGNOSIS — R2241 Localized swelling, mass and lump, right lower limb: Secondary | ICD-10-CM | POA: Diagnosis present

## 2022-02-08 NOTE — Progress Notes (Signed)
Surgical History and Physical    HPI: The patient is a 66 year old female, here for evaluation of a right sided lower extremity mass.  The patient fell approximately a year ago had a bruise without ecchymoses.  At that time no evidence of hematoma.  Since then she has noticed lump in that area.  No discoloration no drainage.  She had an ultrasound which I reviewed and interpreted the imaging which reveals a less than 1 cm lesion that is deep to the skin and not well-defined.  No surrounding blood flow.    Past Medical History:  No date: Depression  No date: Elevated cholesterol  No date: HTN (hypertension)  Past Surgical History:  No date: APPENDECTOMY  No date: BREAST BIOPSY; Right      Comment:  neg.  No date: BREAST CYST EXCISION  No date: BUNIONECTOMY  No date: KNEE CARTILAGE SURGERY      Comment:  left  No date: RPR 1ST INGUN HRNA PRETERM INFT RDC      Comment:  as child  No date: VAGINAL HYSTERECTOMY UTERUS 250 GM/<      Comment:  did not remove cervix - due to fibroids  Review of Patient's Allergies indicates:   Contrast dye [iopam*    Hives    Comment:MRI      Social History:  Social History    Tobacco Use      Smoking status: Never      Smokeless tobacco: Never    Alcohol use: Yes      Comment: 1 drink 3-4 days a week    IVDU:  OCCUPATION:    Family History:  Review of patient's family history indicates:  Problem: Heart Disease      Relation: Mother          Age of Onset: (Not Specified)          Comment: died at 59 from CVA?  Problem: Stroke      Relation: Mother          Age of Onset: (Not Specified)          Comment: 49  Problem: Cancer - Prostate      Relation: Father          Age of Onset: (Not Specified)          Comment: 26 from prostate cancer  Problem: Heart Disease      Relation: Father          Age of Onset: (Not Specified)          Comment: angina  Problem: Cancer - Lung      Relation: Sister          Age of Onset: (Not Specified)          Comment: smoker 38  Problem: Diabetes      Relation:  Brother          Age of Onset: (Not Specified)  Problem: Heart      Relation: Brother          Age of Onset: (Not Specified)          Comment: cariomypathy died at 7  Problem: OTHER      Relation: Paternal Grandmother          Age of Onset: (Not Specified)          Comment: 89-90  Problem: OTHER      Relation: Maternal Grandfather          Age of  Onset: (Not Specified)          Comment: died at 35, in wheelchair from horse accident  Problem: OTHER      Relation: Paternal Grandfather          Age of Onset: (Not Specified)          Comment: unknown   Problem: Cancer - Other      Relation: Sister          Age of Onset: (Not Specified)          Comment: leukmeia  Problem: Diabetes      Relation: Brother          Age of Onset: (Not Specified)  Problem: Cancer - Colon      Relation: FamHxNeg          Age of Onset: (Not Specified)  Problem: Cancer - Cervical      Relation: FamHxNeg          Age of Onset: (Not Specified)  Problem: Cancer - Breast      Relation: FamHxNeg          Age of Onset: (Not Specified)  Problem: Cancer - Ovarian      Relation: FamHxNeg          Age of Onset: (Not Specified)  Problem: Alcohol/Drug Abuse      Relation: FamHxNeg          Age of Onset: (Not Specified)      Medications:  (Not in a hospital admission)    famotidine (PEPCID) 20 MG tablet, Take 1 tablet by mouth in the morning and 1 tablet before bedtime., Disp: 60 tablet, Rfl: 5  atorvastatin (LIPITOR) 20 MG tablet, TAKE 1 TABLET BY MOUTH EVERY DAY IN THE MORNING, Disp: 90 tablet, Rfl: 3  escitalopram (LEXAPRO) 20 MG tablet, TAKE 1 TABLET BY MOUTH EVERY DAY, Disp: 90 tablet, Rfl: 0  meloxicam (MOBIC) 7.5 MG tablet, TAKE 1 TABLET BY MOUTH EVERY DAY AS NEEDED FOR PAIN, Disp: 30 tablet, Rfl: 0  Multiple Vitamins-Minerals (THERA-M) TABS, Take 1 tablet by mouth in the morning., Disp: , Rfl:   ascorbic acid (VITAMIN C) 500 MG tablet, Take 500 mg by mouth in the morning., Disp: , Rfl:   VITAMIN D PO, Take 600 mg by mouth daily, Disp: , Rfl:     No  current facility-administered medications on file prior to visit.        Review of Systems:   CONSTITUTIONAL: Negative for fevers, chills, night sweats, weight loss  NEURO: Patient denies any new recent headaches or change in vision.  No  unexplained dizziness, muscle weakness, or paresthesia.   SKIN: Negative for rashes or lesions   CV: Negative for chest pain, palpatations  RESPIRATORY: Negative for SOB, difficulty breathing, wheezing  GI: Negative for abd pain, nausea, vomiting, diarrhea, constipation, reflux, bloody stool  GU: Negative for dysuria, hematuria, suprapubic pain  PSYCH: Negative for anxiety and depression   HEME: Negative for unexplained bruising or bleeding.   MUSK: Negative for any new myalgias      Physical Exam:  VITALS: BP 128/84   Pulse 74   Temp 97.6 F (36.4 C)   CONSTUTIONAL: awake, alert, NAD  NEURO: Moving all extremities symmetrically, no focal deficits.   HEENT: sclera non icteric, moist mucus membranes, NCAT  SKIN:  Dry and warm  PSYCH: normal speech and affect  CV:RRR  RESP: no respiratory distress  GI: Soft nontender nondistended    MUSK: No cyanosis or  edema; the area in question on the right lower extremity does have a well-circumscribed protrusion approximately 3.5 cm in craniocaudal dimension.  It is not well attached and is wide-based and thin.  Interestingly there is a similar protrusion of the left lower extremity although to a lesser degree.  No evidence of ecchymosis or trauma.  No evidence of infection.    LABS:  Lab Results   Component Value Date    NA 139 09/20/2021    K 4.8 09/20/2021    CL 104 09/20/2021    CO2 24 09/20/2021    BUN 18 09/20/2021    CREAT 0.7 09/20/2021    TBILI 0.4 09/20/2021    AST 20 09/20/2021    ALT 20 09/20/2021    ALKPHOS 116 09/20/2021     Lab Results   Component Value Date    WBC 7.1 09/20/2021    HGB 14.4 09/20/2021    HCT 46.3 (H) 09/20/2021    PLTA 307 09/20/2021     Lab Results   Component Value Date    INR 1.0 09/20/2021    PT 11.0  09/20/2021    APTT 32.0 09/20/2021       IMAGING: Interpreted as stated above.    A/P: The patient is a 66 year old female soft tissue mass of the right lower extremity.  Unclear etiology if related to history of recent trauma however there are no concerning features and will monitor the size for interval growth and evaluation.  She understands and agrees with this plan that she helped formulate.  A follow-up for 3 months was made.    Marijean Bravo, MD      I spent a total of 45 minutes on this visit on the date of service (total time includes all activities performed on the date of service)

## 2022-02-16 ENCOUNTER — Ambulatory Visit (HOSPITAL_BASED_OUTPATIENT_CLINIC_OR_DEPARTMENT_OTHER): Payer: Self-pay

## 2022-02-16 NOTE — Telephone Encounter (Signed)
Message       ----- Message -----   From: Hortense Ramal   Sent: 02/16/2022   3:54 PM EST   To: [facility] Mychart Scheduling   Subject: Appointment Request                                Appointment Request From: Hortense Ramal      With Provider: Ferd Glassing, MD [facility]]      Preferred Date Range: 02/19/2022 - 02/23/2022      Preferred Times: Monday Afternoon, Tuesday Afternoon, Wednesday Afternoon, Thursday Afternoon, Friday Afternoon      Reason for visit: Not feeling well      Comments:   I have not been feeling well and keep experiencing pain from head to toe.      Thank you.      Marian Sorrow     Reason for Disposition   Patient wants to be seen   MILD pain (e.g., does not interfere with normal activities) and present > 7 days   Mild pain (e.g., does not interfere with normal activities) and present < 7 days    Answer Assessment - Initial Assessment Questions  Pt complains of pains everywhere every day. Pains seem to move from head to toe. Fatigue, nausea, and even her teeth hurt.  This has been going on for a month now.  She didn't mention it at her recent appt.    Protocols used: Muscle Aches and Body Pain-A-OH    Advised per nursing triage protocol.  Verbalized understanding and agreement with instructions and disposition.     Recommended disposition for patient:Disposition: See in Office within 3 days    If patient referred to UC/ED advised that they may require further follow up and testing after the visit with their primary care office.     Instructed patient to call back for any new, worsening, or worrisome symptoms or concerns any time day or night.

## 2022-02-23 ENCOUNTER — Ambulatory Visit (HOSPITAL_BASED_OUTPATIENT_CLINIC_OR_DEPARTMENT_OTHER): Payer: Medicare HMO | Admitting: Registered Nurse

## 2022-02-27 ENCOUNTER — Encounter (HOSPITAL_BASED_OUTPATIENT_CLINIC_OR_DEPARTMENT_OTHER): Payer: Self-pay | Admitting: Internal Medicine

## 2022-02-27 ENCOUNTER — Other Ambulatory Visit: Payer: Self-pay

## 2022-02-27 ENCOUNTER — Ambulatory Visit: Payer: Medicare HMO | Attending: Internal Medicine | Admitting: Internal Medicine

## 2022-02-27 VITALS — BP 128/80 | HR 75 | Ht 68.0 in | Wt 171.0 lb

## 2022-02-27 DIAGNOSIS — F419 Anxiety disorder, unspecified: Secondary | ICD-10-CM | POA: Insufficient documentation

## 2022-02-27 DIAGNOSIS — M25512 Pain in left shoulder: Secondary | ICD-10-CM | POA: Insufficient documentation

## 2022-02-27 DIAGNOSIS — M25562 Pain in left knee: Secondary | ICD-10-CM | POA: Insufficient documentation

## 2022-02-27 DIAGNOSIS — R030 Elevated blood-pressure reading, without diagnosis of hypertension: Secondary | ICD-10-CM | POA: Insufficient documentation

## 2022-02-27 DIAGNOSIS — M542 Cervicalgia: Secondary | ICD-10-CM | POA: Insufficient documentation

## 2022-02-27 DIAGNOSIS — G8929 Other chronic pain: Secondary | ICD-10-CM | POA: Diagnosis not present

## 2022-02-27 DIAGNOSIS — Z1231 Encounter for screening mammogram for malignant neoplasm of breast: Secondary | ICD-10-CM | POA: Diagnosis not present

## 2022-02-27 DIAGNOSIS — J309 Allergic rhinitis, unspecified: Secondary | ICD-10-CM | POA: Diagnosis not present

## 2022-02-27 DIAGNOSIS — J392 Other diseases of pharynx: Secondary | ICD-10-CM | POA: Diagnosis present

## 2022-02-27 DIAGNOSIS — E7849 Other hyperlipidemia: Secondary | ICD-10-CM

## 2022-02-27 DIAGNOSIS — F330 Major depressive disorder, recurrent, mild: Secondary | ICD-10-CM | POA: Diagnosis present

## 2022-02-27 MED ORDER — FLUTICASONE PROPIONATE 50 MCG/ACT NA SUSP
1.0000 | Freq: Every day | NASAL | 5 refills | Status: DC
Start: 2022-02-27 — End: 2022-06-24

## 2022-02-27 MED ORDER — BUSPIRONE HCL 5 MG PO TABS
5.0000 mg | ORAL_TABLET | Freq: Two times a day (BID) | ORAL | 0 refills | Status: DC
Start: 2022-02-27 — End: 2022-03-14

## 2022-02-27 MED ORDER — ROSUVASTATIN CALCIUM 5 MG PO TABS
5.0000 mg | ORAL_TABLET | Freq: Every day | ORAL | 1 refills | Status: DC
Start: 2022-02-27 — End: 2022-06-24

## 2022-02-27 NOTE — Progress Notes (Signed)
Denise Gutierrez is a 66 year old female who presents for follow-up and pain    Pains  NOT pain all over as written in nurse triage  Various joints/MSK pains  Back lower - on and off  Shouler pain on left on and off  Left knee pain on and off but no calf/thigh/muscular pain  No amr pain    Acute illness  Sore throat x 3 weeks  Irrated, scratchy  Ache in eyes but not pain with eye movement, change in vision , spots or flashes, discharge, watery eyes, no feeling of sand or grit  Achey head but no trhobbbing , not worse headche,   Neck tightness  Chiropractor has been helpful in past    HLD  Lipidtor gave diarrhea so stopped  The 10-year ASCVD risk score (Arnett DK, et al., 2019) is: 6.7%    Values used to calculate the score:      Age: 76 years      Sex: Female      Is Non-Hispanic African American: No      Diabetic: No      Tobacco smoker: No      Systolic Blood Pressure: 128 mmHg      Is BP treated: No      HDL Cholesterol: 64 mg/dL      Total Cholesterol: 267 mg/dL    Cholesterol (mg/dL)   Date Value   78/93/8101 267 (H)   07/05/2021 259 (H)   03/13/2021 255 (H)     LOW DENSITY LIPOPROTEIN DIRECT (mg/dL)   Date Value   75/12/2583 195 (H)   07/05/2021 180   03/13/2021 173     HIGH DENSITY LIPOPROTEIN (mg/dL)   Date Value   27/78/2423 64 (H)   07/05/2021 62 (H)   03/13/2021 48     TRIGLYCERIDES (mg/dL)   Date Value   53/61/4431 142   07/05/2021 139   03/13/2021 213 (H)     Elevated BP without diagnosis of HTN  Most Recent BP Reading(s)  02/27/22 : 128/80  02/08/22 : 128/84  01/05/22 : 139/87  09/07/21 : 132/79  07/13/21 : 128/78    Pre-DM  HEMOGLOBIN A1C (%)   Date Value   09/20/2021 5.7 (H)   07/05/2021 5.6   03/13/2021 6.2 (H)     Anxiety/MDD  Continue to feel very anxious   Decreased escitalopram to 10mg  a couple days ago because felt like not helping  Has been trying to find therapist for long term but has not found anyone    From last visit April 2023  Notes having increased anxiety/ panic 1-2 times a week  Interested  in medication for this  Feels like escitalopram has been helpful so hesitant to change this  - PHQ-9 score of 1 and GAD 7 score of 3  No S/HI or thoughts of self harm  - Patient feels escitalopgram has been very helpful to her but notes some increased episode of anxiety 1-2 times a week.  Discussed trial of prn med of hydroxyzine (will start low dose and titrate up).  Reduced risks of sedation/dizziness and should not drive after taking.  If anxiety becomes more often, could consider adding standing Buspar  - Will continue to monitor   -     hydrOXYzine (ATARAX) 10 MG tablet; Take 1 or 2 pills every 8 hours as needed for anxiety  From last visit Sept 2022   Happy with escitalopram 20mg e  From last visit March 2022  From initial  in person visit  Anxiety disorder, unspecified type  Major depressive disorder, recurrent, in remission(HCC)  - PHQ-9 score of 0 and GAd 7 score of 1  - Patient reports good mood and wishes to continue on escitalopram 20mg   - Continue to monitor  -     escitalopram (LEXAPRO) 20 MG tablet; Take 1 tablet by mouth daily  Spoke with MHCP  Has been trying to find therapist, has not connected so far   Anxiety disorder, unspecified type  Major depressive disorder, recurrent, in partial remission (HCC)  - PHQ-9 scorer of 1 and GAD 7 score of 7  - Continue Lexapro 10mg   - To continue to look for therapist as discussed with MHCP  - Recommend trying some of the apps given to patient by the MHCP  - Continue to monitor  From initial TV  On lexapro - needs refills 10mg  anxiety > depression  4 years on 10mg  the whole time until recently started to run out  No other medications  Needs referral to htearpist   Many years ago had therapist years ago for couple session but not in years    Review of Systems: as per hPI    famotidine (PEPCID) 20 MG tablet, Take 1 tablet by mouth in the morning and 1 tablet before bedtime., Disp: 60 tablet, Rfl: 5  atorvastatin (LIPITOR) 20 MG tablet, TAKE 1 TABLET BY MOUTH EVERY  DAY IN THE MORNING, Disp: 90 tablet, Rfl: 3  escitalopram (LEXAPRO) 20 MG tablet, TAKE 1 TABLET BY MOUTH EVERY DAY, Disp: 90 tablet, Rfl: 0  meloxicam (MOBIC) 7.5 MG tablet, TAKE 1 TABLET BY MOUTH EVERY DAY AS NEEDED FOR PAIN, Disp: 30 tablet, Rfl: 0  Multiple Vitamins-Minerals (THERA-M) TABS, Take 1 tablet by mouth in the morning., Disp: , Rfl:   ascorbic acid (VITAMIN C) 500 MG tablet, Take 500 mg by mouth in the morning., Disp: , Rfl:   VITAMIN D PO, Take 600 mg by mouth daily, Disp: , Rfl:     No current facility-administered medications for this visit.      Past Medical History:  No date: Depression  No date: Elevated cholesterol  No date: HTN (hypertension)  Patient Active Problem List    Left knee pain         Date Noted: 07/17/2021      Left hip pain         Date Noted: 07/17/2021      Left shoulder pain         Date Noted: 07/17/2021      Neck pain         Date Noted: 11/28/2020      Right knee pain         Date Noted: 06/06/2020      Stiffness of hand joint         Date Noted: 06/06/2020      Other hyperlipidemia         Date Noted: 01/05/2020      Elevated BP without diagnosis of hypertension         Date Noted: 11/24/2019      Snoring         Date Noted: 11/24/2019      Toenail deformity         Date Noted: 11/24/2019      Insomnia         Date Noted: 11/24/2019      Major depressive disorder, recurrent, mild (HCC)  Date Noted: 11/24/2019      GAD (generalized anxiety disorder)         Date Noted: 11/24/2019      Past Surgical History:  No date: APPENDECTOMY  No date: BREAST BIOPSY; Right      Comment:  neg.  No date: BREAST CYST EXCISION  No date: BUNIONECTOMY  No date: KNEE CARTILAGE SURGERY      Comment:  left  No date: RPR 1ST INGUN HRNA PRETERM INFT RDC      Comment:  as child  No date: VAGINAL HYSTERECTOMY UTERUS 250 GM/<      Comment:  did not remove cervix - due to fibroids    ALLERGIES:  Review of Patient's Allergies indicates:   Contrast dye [iopam*    Hives     Comment:MRI    Immunization History   Administered Date(s) Administered    Covid-19 Vaccine (Moderna - Full Dose) 02/22/2020, 08/14/2020    Covid-19 Vaccine (Moderna Bivalent 66yo>) 12/04/2020    Covid-19 Vaccine (Pfizer - Purple Cap) 06/27/2019, 07/19/2019    Influenza Vac Quad (Aiiv4) inact, Adjuv Pre Free 0.255ml 12/04/2021    Influenza Vaccine High Dose 0.367ml 65 And Older 01/09/2021    Influenza Virus Quad Presv Free Vacc 6 Mo and Older, IM 04/15/2016, 12/04/2018    Influenza Virus Tri Presv Free 3/> YRS IM 01/28/2015    Influenza, Unspecified Formulation 02/22/2017    Moderna >12yo Covid-19 Fall 2023 01/05/2022    PCV13 06/02/2020    PNEUMOCOCCAL POLYSACCHARIDE VACCINE v23 07/13/2021    Tdap 01/05/2020    Zoster Vacc (HZV) Recomb Adjv, IM, 2 Dose, (SHINGRIX) 12/21/2019, 04/22/2020       PHYSICAL EXAM:   02/27/22  1136 02/27/22  1232   BP: 139/85 128/80   Site:  Right Arm   Position:  Sitting   Cuff Size:  Large   Pulse: 75    SpO2: 98%    Weight: 77.6 kg (171 lb)    Height: 5\' 8"  (1.727 m)      Constitutional: Well developed, Well nourished, No acute distress,  HEENT: NCAT, OP clear, MMM, no tonsilar exudate/erythema + posterior mucous, sclera anicteric no sinus TTP, boggy nasal turbinates  Neck supple, no cervical spine TTP, bilateral tightness of trapezius muscles, no cervical LAD  CV:: RRR,NL S1/S2,  No murmurs, rubs or gallops.   Resp::CTAB, No respiratory distress, No wheezing, crackles, rhonchi  MSK: knees with no instability or TTP   Left shoulder with pain on external rotation and abduction, positive empty can test, neg Hawkins-Kennedy test  Extremities: , WWP, No edema.  Neurologic:  No focal deficits noted.     ASSESSMENT/PLAN:  Diagnoses and all orders for this visit:    Pt NOT with pain "all over" as nurse triage wrote bu with specific joint pains  No pain in muscles of leg, only sometimes left kne pain  No pain in upper extermities      Chronic left shoulder pain   - Pt had been to PT earlier int he  year  - Has not been doing HEP daily - advised to do daily  - Will refer to orthopedics for further eval - given number to call to schedule  -     REFERRAL TO ORTHOPEDICS (INT)    Allergic rhinitis, unspecified seasonality, unspecified trigger  Throat irritation  - Symptoms c/w PND/ ETD  - Advised to trial daily Flonase to see if helps with symptoms  - Will have follow-up over TV in  2 weeks  -     fluticasone (FLONASE) 50 MCG/ACT nasal spray; 1 spray by Each Nostril route in the morning.    Other hyperlipidemia  - Reviewed lipids very elevated an recommended for statin based on ASCVD risk   - Did not tolerate atorvastatin due to diarrhea, will trial low dose rosuvastatin and titrate up if tolerating  - Continue to discuss reason to take medication is prevent strokes that can lead to disability and death  -     rosuvastatin (CRESTOR) 5 MG tablet; Take 1 tablet by mouth in the morning.    Major depressive disorder, recurrent, mild (HCC)  Anxiety disorder, unspecified type  - PhQ-9 score of 2 and GAD 7 score of 7  - Patient feeling significant anxiety  Interested in trial of Buspar - discussed how to take and potential SE  - Will continue escitalopram at 10mg  for now  - Follow-up in 2-3 weeks  -     busPIRone (BUSPAR) 5 MG tablet; Take 1 tablet by mouth in the morning and 1 tablet before bedtime.    Left knee pain, unspecified chronicity  - Unable to exam completely as pt wearing both stockings and jeans and doesn't want to completely undress  - Decnies warmth, swelling or erythema.  No pain elicited on exam and no insabiility apprecaited  - Could consider PT if becomes more active  - Follow-up at TV in 2 weeks    Neck pain  - Tightness of neck muscles on exam with supple neck and no spinal TTP  - Pt plans on looking into chiropractor for this  - Can also try PT   - Will also recommend topicals    Elevated BP without diagnosis of hypertension  - Repeat BP improved and mild elevation  - No med needed at this time,  continue to monitor    Encounter for screening mammogram for malignant neoplasm of breast  - Due, discussed, given number to call to schedule  -     [place] SCREENING MAMMO BILATERAL DIGITAL WITH DBT & CAD; Future

## 2022-02-27 NOTE — Patient Instructions (Signed)
Follow up with dentist  Eye exam  Start buspar 5mg  twice a day - after a week or two can increase to 10mg  if you want - for anxiety   Start rosuvastatin (Crestor) 5mg  - for cholesterol

## 2022-03-14 ENCOUNTER — Ambulatory Visit: Payer: Medicare HMO | Attending: Internal Medicine | Admitting: Internal Medicine

## 2022-03-14 DIAGNOSIS — F419 Anxiety disorder, unspecified: Secondary | ICD-10-CM | POA: Diagnosis present

## 2022-03-14 DIAGNOSIS — F3340 Major depressive disorder, recurrent, in remission, unspecified: Secondary | ICD-10-CM | POA: Insufficient documentation

## 2022-03-14 MED ORDER — BUSPIRONE HCL 5 MG PO TABS
5.0000 mg | ORAL_TABLET | Freq: Two times a day (BID) | ORAL | 1 refills | Status: DC
Start: 2022-03-14 — End: 2022-06-24

## 2022-03-14 MED ORDER — ESCITALOPRAM OXALATE 20 MG PO TABS
20.0000 mg | ORAL_TABLET | Freq: Every day | ORAL | 1 refills | Status: DC
Start: 2022-03-14 — End: 2022-06-24

## 2022-03-14 NOTE — Progress Notes (Signed)
CC: med follow-up    Denise Gutierrez is a 66 year old female presenting for med follow-up    Anxiety/MDD  Feels like buspar very helpful. Very happy with dose   Has not noticed any SE or issues  Has continue on escitalpram    From last visit Dec 5th 2023  Continue to feel very anxious   Decreased escitalopram to 10mg  a couple days ago because felt like not helping  Has been trying to find therapist for long term but has not found anyone  - PhQ-9 score of 2 and GAD 7 score of 7  - Patient feeling significant anxiety  Interested in trial of Buspar - discussed how to take and potential SE  - Will continue escitalopram at 10mg  for now  - Follow-up in 2-3 weeks  -     busPIRone (BUSPAR) 5 MG tablet; Take 1 tablet by mouth in the morning and 1 tablet before bedtime.    From last visit April 2023  Notes having increased anxiety/ panic 1-2 times a week  Interested in medication for this  Feels like escitalopram has been helpful so hesitant to change this  - PHQ-9 score of 1 and GAD 7 score of 3  No S/HI or thoughts of self harm  - Patient feels escitalopgram has been very helpful to her but notes some increased episode of anxiety 1-2 times a week.  Discussed trial of prn med of hydroxyzine (will start low dose and titrate up).  Reduced risks of sedation/dizziness and should not drive after taking.  If anxiety becomes more often, could consider adding standing Buspar  - Will continue to monitor   -     hydrOXYzine (ATARAX) 10 MG tablet; Take 1 or 2 pills every 8 hours as needed for anxiety  From last visit Sept 2022   Happy with escitalopram 20mg e  From last visit March 2022  From initial in person visit  Anxiety disorder, unspecified type  Major depressive disorder, recurrent, in remission(HCC)  - PHQ-9 score of 0 and GAd 7 score of 1  - Patient reports good mood and wishes to continue on escitalopram 20mg   - Continue to monitor  -     escitalopram (LEXAPRO) 20 MG tablet; Take 1 tablet by mouth daily  Spoke with MHCP  Has been  trying to find therapist, has not connected so far   Anxiety disorder, unspecified type  Major depressive disorder, recurrent, in partial remission (HCC)  - PHQ-9 scorer of 1 and GAD 7 score of 7  - Continue Lexapro 10mg   - To continue to look for therapist as discussed with MHCP  - Recommend trying some of the apps given to patient by the MHCP  - Continue to monitor  From initial TV  On lexapro - needs refills 10mg  anxiety > depression  4 years on 10mg  the whole time until recently started to run out  No other medications  Needs referral to htearpist   Many years ago had therapist years ago for couple session but not in years    ROS as per hPI     Patient Active Problem List:     Elevated BP without diagnosis of hypertension     Snoring     Toenail deformity     Insomnia     Major depressive disorder, recurrent, mild (HCC)     GAD (generalized anxiety disorder)     Other hyperlipidemia     Right knee pain     Stiffness  of hand joint     Neck pain     Left knee pain     Left hip pain     Left shoulder pain     fluticasone (FLONASE) 50 MCG/ACT nasal spray, 1 spray by Each Nostril route in the morning., Disp: 15.8 mL, Rfl: 5  busPIRone (BUSPAR) 5 MG tablet, Take 1 tablet by mouth in the morning and 1 tablet before bedtime., Disp: 60 tablet, Rfl: 0  rosuvastatin (CRESTOR) 5 MG tablet, Take 1 tablet by mouth in the morning., Disp: 90 tablet, Rfl: 1  famotidine (PEPCID) 20 MG tablet, Take 1 tablet by mouth in the morning and 1 tablet before bedtime., Disp: 60 tablet, Rfl: 5  escitalopram (LEXAPRO) 20 MG tablet, TAKE 1 TABLET BY MOUTH EVERY DAY, Disp: 90 tablet, Rfl: 0  meloxicam (MOBIC) 7.5 MG tablet, TAKE 1 TABLET BY MOUTH EVERY DAY AS NEEDED FOR PAIN, Disp: 30 tablet, Rfl: 0  Multiple Vitamins-Minerals (THERA-M) TABS, Take 1 tablet by mouth in the morning., Disp: , Rfl:   ascorbic acid (VITAMIN C) 500 MG tablet, Take 500 mg by mouth in the morning., Disp: , Rfl:   VITAMIN D PO, Take 600 mg by mouth daily, Disp: , Rfl:      No current facility-administered medications on file prior to visit.    Review of Patient's Allergies indicates:   Contrast dye [iopam*    Hives    Comment:MRI     Social History    Tobacco Use      Smoking status: Never      Smokeless tobacco: Never    Alcohol use: Yes      Comment: 1 drink 3-4 days a week    Drug use: Yes      Comment: once every 2-3 weeks, edible and vaping    Exam - vitals deferred in setting o televisit  Speaking in complete sentences, breathing comfortably    Assessment/Plan:  Diagnoses and all orders for this visit:    Anxiety disorder, unspecified type  MDD (recurrent major depressive disorder) in remission (HCC)  - Patient feels low dose Buspar has been very helpful in treating her anxiety. Declines to increase dose at this time.  Aware can contact clinic if feels needs dose adjustment in future. Will continue buspar 5mg  bid and escitalopram daily  - Will have patient follow-up in Spring or sooner prn  -     escitalopram (LEXAPRO) 20 MG tablet; Take 1 tablet by mouth in the morning.  -     busPIRone (BUSPAR) 5 MG tablet; Take 1 tablet by mouth in the morning and 1 tablet before bedtime.

## 2022-03-16 ENCOUNTER — Encounter (HOSPITAL_BASED_OUTPATIENT_CLINIC_OR_DEPARTMENT_OTHER): Payer: Self-pay | Admitting: Internal Medicine

## 2022-04-04 ENCOUNTER — Encounter (HOSPITAL_BASED_OUTPATIENT_CLINIC_OR_DEPARTMENT_OTHER): Payer: Self-pay

## 2022-04-30 ENCOUNTER — Other Ambulatory Visit: Payer: Self-pay

## 2022-04-30 ENCOUNTER — Encounter (HOSPITAL_BASED_OUTPATIENT_CLINIC_OR_DEPARTMENT_OTHER): Payer: Self-pay

## 2022-04-30 ENCOUNTER — Ambulatory Visit
Admission: RE | Admit: 2022-04-30 | Discharge: 2022-04-30 | Disposition: A | Discharge status: Home / Self Care | Payer: Medicare HMO | Attending: Student in an Organized Health Care Education/Training Program | Admitting: Student in an Organized Health Care Education/Training Program

## 2022-04-30 DIAGNOSIS — Z1231 Encounter for screening mammogram for malignant neoplasm of breast: Secondary | ICD-10-CM | POA: Diagnosis present

## 2022-05-17 ENCOUNTER — Ambulatory Visit (HOSPITAL_BASED_OUTPATIENT_CLINIC_OR_DEPARTMENT_OTHER): Payer: Medicare HMO | Admitting: Internal Medicine

## 2022-05-17 ENCOUNTER — Ambulatory Visit (HOSPITAL_BASED_OUTPATIENT_CLINIC_OR_DEPARTMENT_OTHER): Payer: Medicare HMO | Admitting: Surgery

## 2022-05-24 ENCOUNTER — Ambulatory Visit (HOSPITAL_BASED_OUTPATIENT_CLINIC_OR_DEPARTMENT_OTHER): Payer: Medicare HMO | Admitting: Surgery

## 2022-06-21 ENCOUNTER — Other Ambulatory Visit: Payer: Self-pay

## 2022-06-21 ENCOUNTER — Ambulatory Visit: Payer: Medicare HMO | Attending: Internal Medicine | Admitting: Internal Medicine

## 2022-06-21 ENCOUNTER — Encounter (HOSPITAL_BASED_OUTPATIENT_CLINIC_OR_DEPARTMENT_OTHER): Payer: Self-pay | Admitting: Internal Medicine

## 2022-06-21 VITALS — BP 136/82 | HR 72 | Ht 68.0 in | Wt 173.0 lb

## 2022-06-21 DIAGNOSIS — J392 Other diseases of pharynx: Secondary | ICD-10-CM | POA: Insufficient documentation

## 2022-06-21 DIAGNOSIS — R110 Nausea: Secondary | ICD-10-CM | POA: Diagnosis present

## 2022-06-21 DIAGNOSIS — R1013 Epigastric pain: Secondary | ICD-10-CM

## 2022-06-21 DIAGNOSIS — R7309 Other abnormal glucose: Secondary | ICD-10-CM | POA: Insufficient documentation

## 2022-06-21 DIAGNOSIS — M542 Cervicalgia: Secondary | ICD-10-CM | POA: Insufficient documentation

## 2022-06-21 DIAGNOSIS — M25561 Pain in right knee: Secondary | ICD-10-CM | POA: Insufficient documentation

## 2022-06-21 DIAGNOSIS — E7849 Other hyperlipidemia: Secondary | ICD-10-CM | POA: Diagnosis present

## 2022-06-21 DIAGNOSIS — R030 Elevated blood-pressure reading, without diagnosis of hypertension: Secondary | ICD-10-CM | POA: Diagnosis present

## 2022-06-21 DIAGNOSIS — F419 Anxiety disorder, unspecified: Secondary | ICD-10-CM | POA: Insufficient documentation

## 2022-06-21 DIAGNOSIS — J309 Allergic rhinitis, unspecified: Secondary | ICD-10-CM | POA: Diagnosis present

## 2022-06-21 DIAGNOSIS — F3340 Major depressive disorder, recurrent, in remission, unspecified: Secondary | ICD-10-CM

## 2022-06-21 DIAGNOSIS — F330 Major depressive disorder, recurrent, mild: Secondary | ICD-10-CM | POA: Diagnosis present

## 2022-06-21 MED ORDER — CYCLOBENZAPRINE HCL 5 MG PO TABS
5.00 mg | ORAL_TABLET | Freq: Every evening | ORAL | 0 refills | Status: AC | PRN
Start: 2022-06-21 — End: 2022-06-28

## 2022-06-21 MED ORDER — FAMOTIDINE 20 MG PO TABS
20.00 mg | ORAL_TABLET | Freq: Two times a day (BID) | ORAL | 5 refills | Status: AC
Start: 2022-06-21 — End: 2022-12-18

## 2022-06-21 NOTE — Progress Notes (Signed)
Denise Gutierrez is a 67 year old female who presents for follow-up    Nausea   Started a month ago  More when stressed  Comes and goes  No vomtiing, diarrhea, consitpation, BRBPR, melena   No significnat abd pain  No headache    Joint pains  No shoulder pain  Right knee pain sometimes No warmth, redness, swelling  Neck pain Both sides  Tight, pain     From last in person visit Dec 2023  NOT pain all over as written in nurse triage  Various joints/MSK pains  Back lower - on and off  Shouler pain on left on and off  Left knee pain on and off but no calf/thigh/muscular pain  No amr pain    Pt NOT with pain "all over" as nurse triage wrote bu with specific joint pains  No pain in muscles of leg, only sometimes left kne pain  No pain in upper extermities  Chronic left shoulder pain   - Pt had been to PT earlier int he year  - Has not been doing HEP daily - advised to do daily  - Will refer to orthopedics for further eval - given number to call to schedule  -     REFERRAL TO ORTHOPEDICS (INT)  Left knee pain, unspecified chronicity  - Unable to exam completely as pt wearing both stockings and jeans and doesn't want to completely undress  - Decnies warmth, swelling or erythema.  No pain elicited on exam and no insabiility apprecaited  - Could consider PT if becomes more active  - Follow-up at [facility] in 2 weeks  Neck pain  - Tightness of neck muscles on exam with supple neck and no spinal TTP  - Pt plans on looking into chiropractor for this  - Can also try PT   - Will also recommend topicals    Pre-DM   HEMOGLOBIN A1C (%)   Date Value   09/20/2021 5.7 (H)   07/05/2021 5.6   03/13/2021 6.2 (H)     Elevated BP without diagnosis of HTN  Most Recent BP Reading(s)  06/21/22 : 136/82  02/27/22 : 128/80  02/08/22 : 128/84  01/05/22 : 139/87  09/07/21 : 132/79     HLD  Lipidtor gave diarrhea so stopped    From last in person visit Dec 2023  - Reviewed lipids very elevated an recommended for statin based on ASCVD risk   - Did not tolerate  atorvastatin due to diarrhea, will trial low dose rosuvastatin and titrate up if tolerating  - Continue to discuss reason to take medication is prevent strokes that can lead to disability and death  -     rosuvastatin (CRESTOR) 5 MG tablet; Take 1 tablet by mouth in the morning.    Cholesterol (mg/dL)   Date Value   67/54/4920 267 (H)   07/05/2021 259 (H)   03/13/2021 255 (H)     LOW DENSITY LIPOPROTEIN DIRECT (mg/dL)   Date Value   12/30/1217 195 (H)   07/05/2021 180   03/13/2021 173     HIGH DENSITY LIPOPROTEIN (mg/dL)   Date Value   75/88/3254 64 (H)   07/05/2021 62 (H)   03/13/2021 48     TRIGLYCERIDES (mg/dL)   Date Value   98/26/4158 142   07/05/2021 139   03/13/2021 213 (H)     The 10-year ASCVD risk score (Arnett DK, et al., 2019) is: 8.3%    Values used to calculate the score:  Age: 54 years      Sex: Female      Is Non-Hispanic African American: No      Diabetic: No      Tobacco smoker: No      Systolic Blood Pressure: 136 mmHg      Is BP treated: No      HDL Cholesterol: 64 mg/dL      Total Cholesterol: 267 mg/dL       Allergic rhinitis, unspecified seasonality, unspecified trigger  Throat irritation  - Symptoms c/w PND/ ETD  - Advised to trial daily Flonase to see if helps with symptoms  - Will have follow-up over TV in 2 weeks  -     fluticasone (FLONASE) 50 MCG/ACT nasal spray; 1 spray by Each Nostril route in the morning.    Anxiety/MDD  Continues on Lexapro 10mg  daily and Buspar 5mg  bid    Last TV Dec 2023  Feels like buspar very helpful. Very happy with dose   Has not noticed any SE or issues  Has continue on escitalpram  - Patient feels low dose Buspar has been very helpful in treating her anxiety. Declines to increase dose at this time.  Aware can contact clinic if feels needs dose adjustment in future. Will continue buspar 5mg  bid and escitalopram daily  - Will have patient follow-up in Spring or sooner prn  -     escitalopram (LEXAPRO) 20 MG tablet; Take 1 tablet by mouth in the morning.  -      busPIRone (BUSPAR) 5 MG tablet; Take 1 tablet by mouth in the morning and 1 tablet before bedtime.    From last visit Dec 5th 2023  Continue to feel very anxious   Decreased escitalopram to 10mg  a couple days ago because felt like not helping  Has been trying to find therapist for long term but has not found anyone  - PhQ-9 score of 2 and GAD 7 score of 7  - Patient feeling significant anxiety  Interested in trial of Buspar - discussed how to take and potential SE  - Will continue escitalopram at 10mg  for now  - Follow-up in 2-3 weeks  -     busPIRone (BUSPAR) 5 MG tablet; Take 1 tablet by mouth in the morning and 1 tablet before bedtime.     Review of Systems: as per hPI      escitalopram (LEXAPRO) 20 MG tablet, Take 1 tablet by mouth in the morning., Disp: 90 tablet, Rfl: 1  busPIRone (BUSPAR) 5 MG tablet, Take 1 tablet by mouth in the morning and 1 tablet before bedtime., Disp: 180 tablet, Rfl: 1  fluticasone (FLONASE) 50 MCG/ACT nasal spray, 1 spray by Each Nostril route in the morning., Disp: 15.8 mL, Rfl: 5  rosuvastatin (CRESTOR) 5 MG tablet, Take 1 tablet by mouth in the morning., Disp: 90 tablet, Rfl: 1  famotidine (PEPCID) 20 MG tablet, Take 1 tablet by mouth in the morning and 1 tablet before bedtime., Disp: 60 tablet, Rfl: 5  Multiple Vitamins-Minerals (THERA-M) TABS, Take 1 tablet by mouth in the morning., Disp: , Rfl:   ascorbic acid (VITAMIN C) 500 MG tablet, Take 500 mg by mouth in the morning., Disp: , Rfl:   VITAMIN D PO, Take 600 mg by mouth daily, Disp: , Rfl:     No current facility-administered medications for this visit.      Past Medical History:  No date: Depression  No date: Elevated cholesterol  No date: HTN (hypertension)  Patient Active Problem List    Left knee pain         Date Noted: 07/17/2021      Left hip pain         Date Noted: 07/17/2021      Left shoulder pain         Date Noted: 07/17/2021      Neck pain         Date Noted: 11/28/2020      Right knee pain         Date  Noted: 06/06/2020      Stiffness of hand joint         Date Noted: 06/06/2020      Other hyperlipidemia         Date Noted: 01/05/2020      Elevated BP without diagnosis of hypertension         Date Noted: 11/24/2019      Snoring         Date Noted: 11/24/2019      Toenail deformity         Date Noted: 11/24/2019      Insomnia         Date Noted: 11/24/2019      Major depressive disorder, recurrent, mild (HCC)         Date Noted: 11/24/2019      GAD (generalized anxiety disorder)         Date Noted: 11/24/2019      Past Surgical History:  No date: APPENDECTOMY  No date: BREAST BIOPSY; Right      Comment:  neg.  No date: BREAST CYST EXCISION  No date: BUNIONECTOMY  No date: KNEE CARTILAGE SURGERY      Comment:  left  No date: RPR 1ST INGUN HRNA PRETERM INFT RDC      Comment:  as child  No date: VAGINAL HYSTERECTOMY UTERUS 250 GM/<      Comment:  did not remove cervix - due to fibroids    ALLERGIES:  Review of Patient's Allergies indicates:   Contrast dye [iopam*    Hives    Comment:MRI    Immunization History   Administered Date(s) Administered    Covid-19 Vaccine (Moderna - Full Dose) 02/22/2020, 08/14/2020    Covid-19 Vaccine (Moderna Bivalent 67yo>) 12/04/2020    Covid-19 Vaccine (Pfizer - Purple Cap) 06/27/2019, 07/19/2019    Influenza Vac Quad (Aiiv4) inact, Adjuv Pre Free 0.51ml 12/04/2021    Influenza Vaccine High Dose 0.73ml 65 And Older 01/09/2021    Influenza Virus Quad Presv Free Vacc 6 Mo and Older, IM 04/15/2016, 12/04/2018    Influenza Virus Tri Presv Free 3/> YRS IM 01/28/2015    Influenza, Unspecified Formulation 02/22/2017    Moderna >12yo Covid-19 Fall 2023 01/05/2022    PCV13 06/02/2020    PNEUMOCOCCAL POLYSACCHARIDE VACCINE v23 07/13/2021    Tdap 01/05/2020    Zoster Vacc (HZV) Recomb Adjv, IM, 2 Dose, (SHINGRIX) 12/21/2019, 04/22/2020       PHYSICAL EXAM:   06/21/22  1647 06/21/22  1752   BP: (!) 150/70 136/82   Site:  Left Arm   Position:  Sitting   Cuff Size:  Large   Pulse: 72    SpO2: 99%     Weight: 78.5 kg (173 lb)    Height: 5\' 8"  (1.727 m)      Constitutional: Well developed, Well nourished, No acute distress,  HEENT: NCAT,  sclera anicteric  Neck: supple, no cervical spine TTP, tightness  Trapezius muscles bialterally  CV:: RRR,NL S1/S2,  No murmurs, rubs or gallops.   Resp::CTAB, No respiratory distress, No wheezing, crackles, rhonchi  Abdomen: Soft,r, non-distended, Normal bowel sounds.Minimal epigastric TTP with no rebound or guarding, no hepatomegaly appreciated  MSK: knees with no warmth, erythema, swelling or instability  Extremities:, WWP, No edema.     ASSESSMENT/PLAN:  Diagnoses and all orders for this visit:    Neck pain  - No cervical spine pain and tightness of trapezius muscles bilaterally  - Strongly encouraged PT and given referral  - Discussed heating pad, OTC topicals  - Will give short course of prn qHS muscle relaxants- discussed risk of sedation/no driiving on med  - Continue to monitor  -     cyclobenzaprine (FLEXERIL) 5 MG tablet; Take 1 tablet by mouth nightly as needed  for up to 7 days  -     REFERRAL TO PHYSICAL THERAPY (EXT)    Abdominal pain, epigastric  Nausea  - Could be reflux/gastritis. Will trial famotidine. Avoid spicy/scidic food  - Check labs below  - Continue to monitor   -     famotidine (PEPCID) 20 MG tablet; Take 1 tablet by mouth in the morning and 1 tablet before bedtime.  -     COMP METABOLIC PANEL, FASTING; Future  -     THYROID SCREEN TSH REFLEX FT4; Future  -     CBC, PLATELET & DIFFERENTIAL; Future    Right knee pain, unspecified chronicity  - No red flag symptoms, referral to PT  -     REFERRAL TO PHYSICAL THERAPY (EXT)    Anxiety disorder, unspecified type  MDD (recurrent major depressive disorder) mild  (HCC)  - PHQ-9 score of 5-8 (missing one input) and GAD 7 score of 6. No SI/HI or thoughts of self harm  - Patient happy with current doses of medication - continue escitalopram  and buspar  bid  - Continue to monitor   -     busPIRone  (BUSPAR) 5 MG tablet; Take 1 tablet by mouth in the morning and 1 tablet before bedtime.  -     escitalopram (LEXAPRO) 10 MG tablet; Take 1 tablet by mouth in the morning.    Allergic rhinitis, unspecified seasonality, unspecified trigger  Throat irritation  - Throat symptoms most likely PND. Will treat with trial of Flonase  -     fluticasone (FLONASE) 50 MCG/ACT nasal spray; 1 spray by Each Nostril route in the morning.    Other hyperlipidemia  - Taking Crestor.  Will check levels  -     rosuvastatin (CRESTOR) 5 MG tablet; Take 1 tablet by mouth in the morning.  -     LIPID PANEL; Future    Elevated BP without diagnosis of hypertension  - BP borderline. Discussed if increases further, would recommend BP med  - Work on low salt diet    Elevated hemoglobin A1c  - Will check level  -     HEMOGLOBIN A1C; Future

## 2022-06-21 NOTE — Patient Instructions (Signed)
Outside referral    Name of facility   Addresst   NPI number   Phone number   Fax number   Date of appointment.

## 2022-06-22 ENCOUNTER — Other Ambulatory Visit (HOSPITAL_BASED_OUTPATIENT_CLINIC_OR_DEPARTMENT_OTHER): Payer: Self-pay | Admitting: Internal Medicine

## 2022-06-22 DIAGNOSIS — E7849 Other hyperlipidemia: Secondary | ICD-10-CM

## 2022-06-22 DIAGNOSIS — F419 Anxiety disorder, unspecified: Secondary | ICD-10-CM

## 2022-06-22 DIAGNOSIS — J309 Allergic rhinitis, unspecified: Secondary | ICD-10-CM

## 2022-06-22 DIAGNOSIS — F3340 Major depressive disorder, recurrent, in remission, unspecified: Secondary | ICD-10-CM

## 2022-06-22 DIAGNOSIS — J392 Other diseases of pharynx: Secondary | ICD-10-CM

## 2022-06-23 ENCOUNTER — Other Ambulatory Visit (HOSPITAL_BASED_OUTPATIENT_CLINIC_OR_DEPARTMENT_OTHER): Payer: Self-pay | Admitting: Internal Medicine

## 2022-06-23 DIAGNOSIS — F419 Anxiety disorder, unspecified: Secondary | ICD-10-CM

## 2022-06-23 NOTE — Telephone Encounter (Signed)
[facility], Denise Gutierrez is a 67 year old female has requested a refill of      -  buspirone        Last Office Visit: 06/21/22 with pcp  Last Physical Exam: 01/05/20      There are no preventive care reminders to display for this patient.     Other Med Adult:  Most Recent BP Reading(s)  06/21/22 : 136/82        Cholesterol (mg/dL)   Date Value   09/20/2021 267 (H)     LOW DENSITY LIPOPROTEIN DIRECT (mg/dL)   Date Value   09/20/2021 195 (H)     HIGH DENSITY LIPOPROTEIN (mg/dL)   Date Value   09/20/2021 64 (H)     TRIGLYCERIDES (mg/dL)   Date Value   09/20/2021 142         THYROID SCREEN TSH REFLEX FT4 (uIU/mL)   Date Value   11/24/2020 1.650         TSH (THYROID STIM HORMONE) (uIU/mL)   Date Value   09/20/2021 2.940       HEMOGLOBIN A1C (%)   Date Value   09/20/2021 5.7 (H)       No results found for: "POCA1C"      INR (no units)   Date Value   09/20/2021 1.0   07/05/2021 1.0   03/13/2021 1.1       SODIUM (mmol/L)   Date Value   09/20/2021 139       POTASSIUM (mmol/L)   Date Value   09/20/2021 4.8           CREATININE (mg/dL)   Date Value   09/20/2021 0.7        Documented patient preferred pharmacies:        [id number] [place], [place] - [address]  Phone: [phone] Fax: [phone]

## 2022-06-23 NOTE — Telephone Encounter (Signed)
[facility] Pharm[facility], Denise Gutierrez is a 67 year old female has requested a refill of escitalopram, rosuvastatin and flonase.      Last Office Visit: 06/21/2022 with PCP  Last Physical Exam: 01/05/2020    There are no preventive care reminders to display for this patient.    Other Med Adult:  Most Recent BP Reading(s)  06/21/22 : 136/82        Cholesterol (mg/dL)   Date Value   09/20/2021 267 (H)     LOW DENSITY LIPOPROTEIN DIRECT (mg/dL)   Date Value   09/20/2021 195 (H)     HIGH DENSITY LIPOPROTEIN (mg/dL)   Date Value   09/20/2021 64 (H)     TRIGLYCERIDES (mg/dL)   Date Value   09/20/2021 142         THYROID SCREEN TSH REFLEX FT4 (uIU/mL)   Date Value   11/24/2020 1.650         TSH (THYROID STIM HORMONE) (uIU/mL)   Date Value   09/20/2021 2.940       HEMOGLOBIN A1C (%)   Date Value   09/20/2021 5.7 (H)       No results found for: "POCA1C"      INR (no units)   Date Value   09/20/2021 1.0   07/05/2021 1.0   03/13/2021 1.1       SODIUM (mmol/L)   Date Value   09/20/2021 139       POTASSIUM (mmol/L)   Date Value   09/20/2021 4.8           CREATININE (mg/dL)   Date Value   09/20/2021 0.7       Documented patient preferred pharmacies:    [id number] [place], [place] [address]  Phone: [phone] Fax: [phone]

## 2022-06-24 MED ORDER — BUSPIRONE HCL 5 MG PO TABS
5.0000 mg | ORAL_TABLET | Freq: Two times a day (BID) | ORAL | 1 refills | Status: DC
Start: 2022-06-24 — End: 2022-10-02

## 2022-06-24 MED ORDER — ESCITALOPRAM OXALATE 10 MG PO TABS
10.0000 mg | ORAL_TABLET | Freq: Every day | ORAL | 1 refills | Status: DC
Start: 2022-06-24 — End: 2022-10-02

## 2022-06-24 MED ORDER — FLUTICASONE PROPIONATE 50 MCG/ACT NA SUSP
1.00 | Freq: Every day | NASAL | 5 refills | Status: AC
Start: 2022-06-24 — End: 2022-12-19

## 2022-06-24 MED ORDER — ROSUVASTATIN CALCIUM 5 MG PO TABS
5.0000 mg | ORAL_TABLET | Freq: Every day | ORAL | 1 refills | Status: DC
Start: 2022-06-24 — End: 2022-10-02

## 2022-06-25 ENCOUNTER — Other Ambulatory Visit: Payer: Self-pay

## 2022-06-25 ENCOUNTER — Ambulatory Visit: Payer: Medicare HMO | Attending: Internal Medicine

## 2022-06-29 ENCOUNTER — Telehealth (HOSPITAL_BASED_OUTPATIENT_CLINIC_OR_DEPARTMENT_OTHER): Payer: Self-pay

## 2022-06-29 NOTE — Telephone Encounter (Addendum)
-----   Message from [place], [place] sent at 06/29/2022 10:30 AM EDT -----  Regarding: Patient requesting order for blood test .  Contact: [phone]  [facility]    Denise Gutierrez [id number], 67 year old, female, Telephone Information:  Home Phone      [phone]  Work Phone      Not on file.  Mobile          [phone]      Patient's Preferred Pharmacy:     [id number] [place], [place] - [address]  Phone: [phone] Fax: [phone]    [place] [address] [place], [place], [place] - [address]  Phone: [phone] Fax: [phone]    [id number] [place], [place] - [address] AT [address]  Phone: [phone] Fax: [phone]      CONFIRMED TODAY:     Cleotis Lema NUMBER:   [phone]  Best time to call back:   Cell phone:   Other phone:    Available times:    Patient's language of care: English    Patient does not need an interpreter.    Patient's PCP: Ferd Glassing., MD    Person calling on behalf of patient: Patient (self)    Calls today with questions and concerns.States that she was here last week for blood and pcp did not place any order and still wants to come in for her routine blood test.

## 2022-06-30 DIAGNOSIS — R7309 Other abnormal glucose: Secondary | ICD-10-CM | POA: Insufficient documentation

## 2022-06-30 DIAGNOSIS — J309 Allergic rhinitis, unspecified: Secondary | ICD-10-CM | POA: Insufficient documentation

## 2022-06-30 DIAGNOSIS — F3340 Major depressive disorder, recurrent, in remission, unspecified: Secondary | ICD-10-CM | POA: Insufficient documentation

## 2022-07-03 ENCOUNTER — Other Ambulatory Visit: Payer: Self-pay

## 2022-07-03 ENCOUNTER — Ambulatory Visit: Payer: Medicare HMO | Attending: Internal Medicine

## 2022-07-03 DIAGNOSIS — E7849 Other hyperlipidemia: Secondary | ICD-10-CM

## 2022-07-03 DIAGNOSIS — R1013 Epigastric pain: Secondary | ICD-10-CM | POA: Diagnosis present

## 2022-07-03 DIAGNOSIS — R7309 Other abnormal glucose: Secondary | ICD-10-CM

## 2022-07-03 LAB — COMP METABOLIC PANEL, FASTING
ALANINE AMINOTRANSFERASE: 24 U/L (ref 12–45)
ALBUMIN: 4.2 g/dL (ref 3.4–5.2)
ALKALINE PHOSPHATASE: 105 U/L (ref 45–117)
ANION GAP: 10 mmol/L (ref 10–22)
ASPARTATE AMINOTRANSFERASE: 18 U/L (ref 8–34)
BILIRUBIN TOTAL: 0.5 mg/dL (ref 0.2–1.0)
BUN (UREA NITROGEN): 21 mg/dL — ABNORMAL HIGH (ref 7–18)
CALCIUM: 9.7 mg/dL (ref 8.5–10.5)
CARBON DIOXIDE: 24 mmol/L (ref 21–32)
CHLORIDE: 106 mmol/L (ref 98–107)
CREATININE: 0.8 mg/dL (ref 0.4–1.2)
ESTIMATED GLOMERULAR FILT RATE: >60 mL/min (ref >60)
GLUCOSE FASTING: 95 mg/dL (ref 74–106)
POTASSIUM: 4.7 mmol/L (ref 3.5–5.1)
SODIUM: 140 mmol/L (ref 136–145)
TOTAL PROTEIN: 6.5 g/dL (ref 6.4–8.2)

## 2022-07-03 LAB — CBC, PLATELET & DIFFERENTIAL
ABSOLUTE BASO COUNT: 0.1 10*3/uL (ref 0.0–0.1)
ABSOLUTE EOSINOPHIL COUNT: 0.4 10*3/uL (ref 0.0–0.8)
ABSOLUTE IMM GRAN COUNT: 0.01 10*3/uL (ref 0.00–0.10)
ABSOLUTE LYMPH COUNT: 2 10*3/uL (ref 0.6–5.9)
ABSOLUTE MONO COUNT: 0.4 10*3/uL (ref 0.2–1.4)
ABSOLUTE NEUTROPHIL COUNT: 3.6 10*3/uL (ref 1.6–8.3)
ABSOLUTE NRBC COUNT: 0 10*3/uL (ref 0.0–0.0)
BASOPHIL %: 1.1 % (ref 0.0–1.2)
EOSINOPHIL %: 6.5 % (ref 0.0–7.0)
HEMATOCRIT: 44.7 % (ref 34.1–44.9)
HEMOGLOBIN: 14.5 g/dL (ref 11.2–15.7)
IMMATURE GRANULOCYTE %: 0.2 % (ref 0.0–1.0)
LYMPHOCYTE %: 30.8 % (ref 15.0–54.0)
MEAN CORP HGB CONC: 32.4 g/dL (ref 31.0–37.0)
MEAN CORPUSCULAR HGB: 27.5 pg (ref 26.0–34.0)
MEAN CORPUSCULAR VOL: 84.8 fl (ref 80.0–100.0)
MEAN PLATELET VOLUME: 11.7 fL (ref 8.7–12.5)
MONOCYTE %: 6.6 % (ref 4.0–13.0)
NEUTROPHIL %: 54.8 % (ref 40.0–75.0)
NRBC %: 0 % (ref 0.0–0.0)
PLATELET COUNT: 276 10*3/uL (ref 150–400)
RBC DISTRIBUTION WIDTH STD DEV: 42.6 fL (ref 35.1–46.3)
RED BLOOD CELL COUNT: 5.27 M/uL — ABNORMAL HIGH (ref 3.90–5.20)
WHITE BLOOD CELL COUNT: 6.5 10*3/uL (ref 4.0–11.0)

## 2022-07-03 LAB — THYROID SCREEN TSH REFLEX FT4: THYROID SCREEN TSH REFLEX FT4: 2.52 u[IU]/mL (ref 0.270–4.200)

## 2022-07-03 LAB — HEMOGLOBIN A1C
ESTIMATED AVERAGE GLUCOSE: 117 mg/dL (ref 74–160)
HEMOGLOBIN A1C: 5.7 % — ABNORMAL HIGH (ref 4.0–5.6)

## 2022-07-03 LAB — LIPID PANEL
Cholesterol: 178 mg/dL (ref 0–239)
HIGH DENSITY LIPOPROTEIN: 63 mg/dL (ref 40–60)
LOW DENSITY LIPOPROTEIN DIRECT: 109 mg/dL (ref 0–189)
TRIGLYCERIDES: 113 mg/dL (ref 0–150)

## 2022-07-16 ENCOUNTER — Encounter (HOSPITAL_BASED_OUTPATIENT_CLINIC_OR_DEPARTMENT_OTHER): Payer: Self-pay

## 2022-07-18 ENCOUNTER — Encounter (HOSPITAL_BASED_OUTPATIENT_CLINIC_OR_DEPARTMENT_OTHER): Payer: Medicare HMO | Admitting: Registered Nurse

## 2022-07-24 ENCOUNTER — Ambulatory Visit (HOSPITAL_BASED_OUTPATIENT_CLINIC_OR_DEPARTMENT_OTHER): Payer: Medicare HMO | Admitting: Registered Nurse
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
## 2022-07-25 ENCOUNTER — Encounter (HOSPITAL_BASED_OUTPATIENT_CLINIC_OR_DEPARTMENT_OTHER): Payer: Self-pay | Admitting: Registered Nurse

## 2022-07-25 ENCOUNTER — Other Ambulatory Visit: Payer: Self-pay

## 2022-07-25 ENCOUNTER — Ambulatory Visit: Payer: Medicare HMO | Attending: Internal Medicine | Admitting: Registered Nurse

## 2022-07-25 VITALS — BP 121/79 | HR 78 | Temp 98.2°F | Ht 68.0 in | Wt 172.0 lb

## 2022-07-25 DIAGNOSIS — R519 Headache, unspecified: Secondary | ICD-10-CM

## 2022-08-11 ENCOUNTER — Ambulatory Visit
Admission: RE | Admit: 2022-08-11 | Discharge: 2022-08-11 | Disposition: A | Discharge status: Home / Self Care | Payer: Medicare HMO | Attending: Diagnostic Radiology | Admitting: Diagnostic Radiology

## 2022-08-11 ENCOUNTER — Other Ambulatory Visit: Payer: Self-pay

## 2022-08-11 DIAGNOSIS — R519 Headache, unspecified: Secondary | ICD-10-CM | POA: Diagnosis present

## 2022-08-13 ENCOUNTER — Encounter (HOSPITAL_BASED_OUTPATIENT_CLINIC_OR_DEPARTMENT_OTHER): Payer: Self-pay | Admitting: Registered Nurse

## 2022-08-28 ENCOUNTER — Encounter (HOSPITAL_BASED_OUTPATIENT_CLINIC_OR_DEPARTMENT_OTHER): Payer: Self-pay | Admitting: Internal Medicine

## 2022-08-28 DIAGNOSIS — F419 Anxiety disorder, unspecified: Secondary | ICD-10-CM

## 2022-08-29 NOTE — Telephone Encounter (Signed)
6/5- Pt has moved, per med list she still has Buspar refills at her current pharmacy. Pt states she does not need it sent to a new pharmacy, she will pick up refills. She states she was just panicky b/c unable to find this s/p her move to a new apartment.

## 2022-08-29 NOTE — Telephone Encounter (Signed)
Patient Comment: I have recently moved and cannot find this medication.i have none left and have been feeling extremely anxious lately.can i get the next refill now?

## 2022-10-02 ENCOUNTER — Ambulatory Visit
Admission: RE | Admit: 2022-10-02 | Discharge: 2022-10-02 | Disposition: A | Discharge status: Home / Self Care | Payer: Medicare HMO

## 2022-10-02 ENCOUNTER — Other Ambulatory Visit: Payer: Self-pay

## 2022-10-02 ENCOUNTER — Encounter (HOSPITAL_BASED_OUTPATIENT_CLINIC_OR_DEPARTMENT_OTHER): Payer: Self-pay | Admitting: Student in an Organized Health Care Education/Training Program

## 2022-10-02 ENCOUNTER — Ambulatory Visit (HOSPITAL_BASED_OUTPATIENT_CLINIC_OR_DEPARTMENT_OTHER): Payer: Medicare HMO | Admitting: Student in an Organized Health Care Education/Training Program

## 2022-10-02 VITALS — BP 138/80 | HR 70 | Ht 68.0 in

## 2022-10-02 DIAGNOSIS — G8929 Other chronic pain: Secondary | ICD-10-CM | POA: Insufficient documentation

## 2022-10-02 DIAGNOSIS — R12 Heartburn: Secondary | ICD-10-CM | POA: Diagnosis present

## 2022-10-02 DIAGNOSIS — M79661 Pain in right lower leg: Secondary | ICD-10-CM | POA: Insufficient documentation

## 2022-10-02 DIAGNOSIS — R196 Halitosis: Secondary | ICD-10-CM

## 2022-10-02 DIAGNOSIS — R030 Elevated blood-pressure reading, without diagnosis of hypertension: Secondary | ICD-10-CM | POA: Diagnosis present

## 2022-10-02 DIAGNOSIS — F330 Major depressive disorder, recurrent, mild: Secondary | ICD-10-CM | POA: Diagnosis present

## 2022-10-02 DIAGNOSIS — D1722 Benign lipomatous neoplasm of skin and subcutaneous tissue of left arm: Secondary | ICD-10-CM | POA: Insufficient documentation

## 2022-10-02 DIAGNOSIS — E7849 Other hyperlipidemia: Secondary | ICD-10-CM | POA: Diagnosis present

## 2022-10-02 DIAGNOSIS — F419 Anxiety disorder, unspecified: Secondary | ICD-10-CM

## 2022-10-02 MED ORDER — ESCITALOPRAM OXALATE 10 MG PO TABS
10.0000 mg | ORAL_TABLET | Freq: Every day | ORAL | 3 refills | Status: DC
Start: 2022-10-02 — End: 2023-06-11

## 2022-10-02 MED ORDER — ROSUVASTATIN CALCIUM 5 MG PO TABS
5.0000 mg | ORAL_TABLET | Freq: Every day | ORAL | 3 refills | Status: DC
Start: 2022-10-02 — End: 2023-06-11

## 2022-10-02 NOTE — Patient Instructions (Signed)
Please call to schedule the Surgery appointment:  Please call the Specialties Scheduling Center [facility]) at [phone] select option 1 and then option 2 and one of our Specialties Schedulers will assist you in scheduling your indicated referral.

## 2022-10-02 NOTE — Progress Notes (Signed)
PRIMARY CARE NOTE - FOLLOW-UP VISIT     SUBJECTIVE  Denise Gutierrez is a 67 year old English speaking female here for:    Chief concern(s):     Pt presents for follow-up and to transfer primary care.     Right shin pain   Near upper shin  Has been hurting her for about 1 year, only when leaning on something   No trauma to the area, not affecting how she is walking     Anxiety   Has been a problem for much of her life  Was on escitalopram, 10 mg up to 20 mg, now back to 10 mg   She reports that she often will do too much, takes on a lot. Moved 2 months ago.  Feels much better now that she is in a new apt.   Stopped buspirone, was taking 5 mg BID  Now lives in a [address] and [address] building  Lives by herself  Has a balcony  Is feeling a lot better in her new place. Living situation previously was stressful, was living in the home of her in laws.     Social history:  Had her son age 49  Values her independence, has lived by herself for a long time   Retired since 2020.   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   Fluent in Spanish  Was one of 6 kids, 2 siblings have died. Denise Gutierrez is in the middle.   Her parents are [place].   Moved to [place] from [place] 3-4 yrs ago   Son and his wife live here in [place] (daughter in Social worker is from [place])   Son works for [employer], and lives in [place]  Pt grew up in the [place]    Halitosis   She has noticed some problems with halitosis, for years, she does diligently brush and floss  Trying to get setup with [employer] [facility], she has an issue with her right back molar   Her allergies are better, she denies sinus symptoms  Sometimes has dry mouth   It is embarrassing to the patient   She has heartburn that is well controlled   Not currently taking PPI or famotidine       ROS - Pertinent symptoms as noted above, other ROS negative      Active Medical Issues:  Patient Active Problem List:     Elevated BP without diagnosis of hypertension     Snoring     Toenail deformity      Insomnia     Major depressive disorder, recurrent, mild (HCC)     GAD (generalized anxiety disorder)     Other hyperlipidemia     Right knee pain     Stiffness of hand joint     Neck pain     Left knee pain     Left hip pain     Left shoulder pain     Elevated hemoglobin A1c     Allergic rhinitis    Past medical, surgical, and social history reviewed. Allergies reviewed.     Medications reviewed:  Current Outpatient Medications   Medication Instructions    ascorbic acid (VITAMIN C) 500 mg, Oral, DAILY    escitalopram (LEXAPRO) 10 mg, Oral, DAILY    famotidine (PEPCID) 20 mg, Oral, 2 TIMES DAILY    fluticasone (FLONASE) 50 MCG/ACT nasal spray 1 spray, Each Nostril, DAILY    Multiple Vitamins-Minerals (THERA-M) TABS 1 tablet, Oral,  DAILY    rosuvastatin (CRESTOR) 5 mg, Oral, DAILY    VITAMIN D PO 600 mg, Oral, DAILY     OBJECTIVE  BP 138/80 (Site: LA, Position: Sitting, Cuff Size: Reg)   Pulse 70   Ht 5\' 8"  (1.727 m)   SpO2 98%   BMI 26.15 kg/m     Physical Exam  Vitals reviewed.   Constitutional:       General: She is not in acute distress.     Appearance: She is not toxic-appearing.   Eyes:      Conjunctiva/sclera: Conjunctivae normal.   Cardiovascular:      Rate and Rhythm: Normal rate.      Heart sounds: No murmur heard.  Pulmonary:      Effort: Pulmonary effort is normal. No respiratory distress.   Musculoskeletal:      Comments: There is a soft tissue mass, about 1.5 cm, non-tender, on anterolateral aspect of left forearm  Right proximal tibia is tender, a little swollen, no increased warmth or erythema   Skin:     General: Skin is warm.   Neurological:      General: No focal deficit present.      Mental Status: She is alert and oriented to person, place, and time.      Gait: Gait normal.   Psychiatric:         Mood and Affect: Mood normal.         Thought Content: Thought content normal.       I personally reviewed relevant labs, imaging, and studies in Epic[facility].     ASSESSMENT AND PLAN  Denise Gutierrez is a 67  year old female who presents with:    1. Pain in right shin  No trauma, present for about 1 year, will check XR to rule out bony abnormality  - XR TIBIA FIBULA RIGHT 2 VIEWS; Future    2. MDD (major depressive disorder), recurrent episode, mild (HCC)  3. Anxiety disorder, unspecified type  Mood improved with change of environment.   Stopped buspirone. Continue escitalopram  - escitalopram (LEXAPRO) 10 MG tablet; Take 1 tablet by mouth in the morning.  Dispense: 90 tablet; Refill: 3    4. Elevated BP without diagnosis of hypertension  Pt has not required BP lowering medication up to this point. She has a family hx HTN and person hx HLD. She snores, she is not having daytime somnolence. She drinks about 3 drinks per week.  We will continue to monitor, discussed that she may require BP medication in the future.    5. Other hyperlipidemia  - rosuvastatin (CRESTOR) 5 MG tablet; Take 1 tablet by mouth in the morning.  Dispense: 90 tablet; Refill: 3    6. Halitosis  7. Heartburn  She is going to have some dental work. We will also check Hpylori  If persists after dental work, we will continue workup  - HELICOBACTER PYLORI STOOL AG; Future    8. Lipoma of left upper extremity  Has had right arm lipoma removal in the past. Discussed referral to surgery, pt understands that this does not mean that pt will be offered surgery, but she is interested in further discussion.   - REFERRAL TO GENERAL SURGERY (INT)      Follow up in approximately 3 months. ER and return precautions reviewed.     I spent a total of 35 minutes on this visit on the date of service (total time includes all activities performed on the date  of service)  ____________    Verlin Dike, DO    *If medications were prescribed today, we discussed Shereka Natter's new and current medications, including risks, benefits, and potential side effects. The patient expressed understanding and no barriers to adherence were identified.  Any tests ordered today were  discussed with the patient.  The patient expressed understanding with plan.    Time spent on the day of the visit on some or all of the following activities:   Preparing to see the patient (eg, review of tests)   Obtaining and/or reviewing separately obtained history   Performing a medically appropriate examination and/or evaluation   Counseling and educating the patient/family/caregiver   Ordering medications, tests, or procedures   Referring and communicating with other health care professionals    Documenting clinical information in the electronic or other health record   Independently interpreting results and  communicating results to the patient/family/caregiver   Care coordination

## 2022-10-03 ENCOUNTER — Telehealth (HOSPITAL_BASED_OUTPATIENT_CLINIC_OR_DEPARTMENT_OTHER): Payer: Self-pay | Admitting: Pharmacist

## 2022-10-03 NOTE — Telephone Encounter (Signed)
Contacted the following pharmacy:         [id number] - [place], [place] - [address]  Phone: [phone] Fax: [phone]    Confirmed Buspirone was cancelled by the pharmacist.

## 2022-10-04 ENCOUNTER — Encounter (HOSPITAL_BASED_OUTPATIENT_CLINIC_OR_DEPARTMENT_OTHER): Payer: Self-pay | Admitting: Student in an Organized Health Care Education/Training Program

## 2022-10-09 ENCOUNTER — Ambulatory Visit: Payer: Medicare HMO | Attending: Student in an Organized Health Care Education/Training Program

## 2022-10-09 ENCOUNTER — Other Ambulatory Visit: Payer: Self-pay

## 2022-10-09 DIAGNOSIS — R12 Heartburn: Secondary | ICD-10-CM | POA: Diagnosis present

## 2022-10-09 LAB — HELICOBACTER PYLORI STOOL AG

## 2022-10-09 NOTE — Progress Notes (Signed)
 Stool dropped off

## 2022-10-19 ENCOUNTER — Encounter (HOSPITAL_BASED_OUTPATIENT_CLINIC_OR_DEPARTMENT_OTHER): Payer: Self-pay | Admitting: Student in an Organized Health Care Education/Training Program

## 2022-10-19 DIAGNOSIS — R12 Heartburn: Secondary | ICD-10-CM

## 2022-10-19 MED ORDER — OMEPRAZOLE 20 MG PO CPDR
20.00 mg | DELAYED_RELEASE_CAPSULE | Freq: Every day | ORAL | 0 refills | Status: AC
Start: 2022-10-19 — End: 2023-01-17

## 2022-10-22 ENCOUNTER — Encounter (HOSPITAL_BASED_OUTPATIENT_CLINIC_OR_DEPARTMENT_OTHER): Payer: Self-pay | Admitting: Student in an Organized Health Care Education/Training Program

## 2022-10-25 ENCOUNTER — Other Ambulatory Visit: Payer: Self-pay

## 2022-10-25 ENCOUNTER — Ambulatory Visit: Payer: Medicare HMO | Attending: Surgery | Admitting: Surgery

## 2022-10-25 VITALS — BP 143/88 | HR 76 | Temp 97.8°F

## 2022-10-25 DIAGNOSIS — R2232 Localized swelling, mass and lump, left upper limb: Secondary | ICD-10-CM | POA: Insufficient documentation

## 2022-10-25 NOTE — Progress Notes (Signed)
Surgical History and Physical    HPI: The patient is a 67 year old female, here for evaluation of left arm mass that has been present for many years here for evaluation of left arm mass that has been present for many years and is slowly increasing in size.  No history of trauma to the area.  No history of trauma to the area.    Past Medical History:  No date: Depression  No date: Elevated cholesterol  No date: HTN (hypertension)  Past Surgical History:  No date: APPENDECTOMY  No date: BREAST BIOPSY; Right      Comment:  neg.  No date: BREAST CYST EXCISION  No date: BUNIONECTOMY  No date: KNEE CARTILAGE SURGERY      Comment:  left  No date: RPR 1ST INGUN HRNA PRETERM INFT RDC      Comment:  as child  No date: VAGINAL HYSTERECTOMY UTERUS 250 GM/<      Comment:  did not remove cervix - due to fibroids  Review of Patient's Allergies indicates:   Contrast dye [iopam*    Hives    Comment:MRI      Social History:  Social History    Tobacco Use      Smoking status: Never      Smokeless tobacco: Never    Alcohol use: Yes      Comment: 1 drink 3-4 days a week    IVDU:  OCCUPATION:    Family History:  Review of patient's family history indicates:  Problem: Heart Disease      Relation: Mother          Age of Onset: (Not Specified)          Comment: died at 62 from CVA?  Problem: Stroke      Relation: Mother          Age of Onset: (Not Specified)          Comment: 70  Problem: Cancer - Prostate      Relation: Father          Age of Onset: (Not Specified)          Comment: 45 from prostate cancer  Problem: Heart Disease      Relation: Father          Age of Onset: (Not Specified)          Comment: angina  Problem: Cancer - Lung      Relation: Sister          Age of Onset: (Not Specified)          Comment: smoker 65, was a [employer] first responder  Problem: Cancer - Other      Relation: Sister          Age of Onset: (Not Specified)          Comment: leukmeia  Problem: High Cholesterol      Relation: Sister          Age of Onset: (Not  Specified)  Problem: Diabetes      Relation: Brother          Age of Onset: (Not Specified)  Problem: Heart      Relation: Brother          Age of Onset: (Not Specified)          Comment: cariomypathy died at 31  Problem: Diabetes      Relation: Brother          Age of  Onset: (Not Specified)  Problem: High Cholesterol      Relation: Brother          Age of Onset: (Not Specified)  Problem: Cancer - Prostate      Relation: Brother          Age of Onset: (Not Specified)  Problem: OTHER      Relation: Maternal Grandfather          Age of Onset: (Not Specified)          Comment: died at 43, in wheelchair from horse accident  Problem: OTHER      Relation: Paternal Grandmother          Age of Onset: (Not Specified)          Comment: 89-90  Problem: OTHER      Relation: Paternal Grandfather          Age of Onset: (Not Specified)          Comment: unknown   Problem: Cancer - Colon      Relation: FamHxNeg          Age of Onset: (Not Specified)  Problem: Cancer - Cervical      Relation: FamHxNeg          Age of Onset: (Not Specified)  Problem: Cancer - Breast      Relation: FamHxNeg          Age of Onset: (Not Specified)  Problem: Cancer - Ovarian      Relation: FamHxNeg          Age of Onset: (Not Specified)  Problem: Alcohol/Drug Abuse      Relation: FamHxNeg          Age of Onset: (Not Specified)      Medications:  (Not in a hospital admission)    omeprazole (PRILOSEC) 20 MG capsule, Take 1 capsule by mouth in the morning., Disp: 90 capsule, Rfl: 0  escitalopram (LEXAPRO) 10 MG tablet, Take 1 tablet by mouth in the morning., Disp: 90 tablet, Rfl: 3  rosuvastatin (CRESTOR) 5 MG tablet, Take 1 tablet by mouth in the morning., Disp: 90 tablet, Rfl: 3  fluticasone (FLONASE) 50 MCG/ACT nasal spray, 1 spray by Each Nostril route in the morning., Disp: 15.8 mL, Rfl: 5  famotidine (PEPCID) 20 MG tablet, Take 1 tablet by mouth in the morning and 1 tablet before bedtime., Disp: 60 tablet, Rfl: 5  Multiple Vitamins-Minerals (THERA-M)  TABS, Take 1 tablet by mouth in the morning., Disp: , Rfl:   ascorbic acid (VITAMIN C) 500 MG tablet, Take 500 mg by mouth in the morning., Disp: , Rfl:   VITAMIN D PO, Take 600 mg by mouth daily, Disp: , Rfl:     No current facility-administered medications on file prior to visit.        Review of Systems:   CONSTITUTIONAL: Negative for fevers, chills, night sweats, weight loss  NEURO: Patient denies any new recent headaches or change in vision.  No  unexplained dizziness, muscle weakness, or paresthesia.   SKIN: Negative for rashes or lesions   CV: Negative for chest pain, palpatations  RESPIRATORY: Negative for SOB, difficulty breathing, wheezing  GI: Negative for abd pain, nausea, vomiting, diarrhea, constipation, reflux, bloody stool  GU: Negative for dysuria, hematuria, suprapubic pain  PSYCH: Negative for anxiety and depression   HEME: Negative for unexplained bruising or bleeding.   MUSK: Negative for any new myalgias      Physical Exam:  VITALS: BP  143/88   Pulse 76   Temp 97.8 F (36.6 C)   CONSTUTIONAL: awake, alert, NAD  NEURO: Moving all extremities symmetrically, no focal deficits.   HEENT: sclera non icteric, moist mucus membranes, NCAT  SKIN:  Dry and warm  PSYCH: normal speech and affect  CV:RRR  RESP: no respiratory distress  GI: Soft nontender nondistended    MUSK: No sinus or edema; the left upper extremity there is a 2 x 3 cm well-circumscribed mass without erythema no fluctuance.  It has some attachments to the posterior underlying musculature.    LABS:  Lab Results   Component Value Date    NA 140 07/03/2022    K 4.7 07/03/2022    CL 106 07/03/2022    CO2 24 07/03/2022    BUN 21 (H) 07/03/2022    CREAT 0.8 07/03/2022    TBILI 0.5 07/03/2022    AST 18 07/03/2022    ALT 24 07/03/2022    ALKPHOS 105 07/03/2022     Lab Results   Component Value Date    WBC 6.5 07/03/2022    HGB 14.5 07/03/2022    HCT 44.7 07/03/2022    PLTA 276 07/03/2022     Lab Results   Component Value Date    INR 1.0  09/20/2021    PT 11.0 09/20/2021    APTT 32.0 09/20/2021           A/P: The patient is a 67 year old female slowly enlarging soft tissue mass.  Left upper extremity.    We discussed:  1-excisional biopsy  2-timing  3-recovery  4-pain symptoms may or may not resolve depending on if the mass is responsible for sx  5-recurrence based on pathology diagnoses  6-need for further procedures       A date and time was chosen for the procedure and she will come back at that time for the excisional biopsy.      I spent a total of 45 minutes on this visit on the date of service (total time includes all activities performed on the date of service)

## 2022-10-26 ENCOUNTER — Ambulatory Visit (HOSPITAL_BASED_OUTPATIENT_CLINIC_OR_DEPARTMENT_OTHER): Payer: Medicare HMO | Admitting: Student in an Organized Health Care Education/Training Program

## 2022-11-28 ENCOUNTER — Ambulatory Visit (HOSPITAL_BASED_OUTPATIENT_CLINIC_OR_DEPARTMENT_OTHER): Payer: Medicare HMO | Admitting: Podiatrist

## 2022-12-06 ENCOUNTER — Other Ambulatory Visit: Payer: Self-pay

## 2022-12-06 ENCOUNTER — Ambulatory Visit: Payer: Medicare HMO | Attending: Surgery | Admitting: Surgery

## 2022-12-06 ENCOUNTER — Ambulatory Visit (HOSPITAL_BASED_OUTPATIENT_CLINIC_OR_DEPARTMENT_OTHER): Payer: Medicare HMO | Admitting: Surgery

## 2022-12-06 VITALS — BP 143/87 | HR 66 | Temp 98.0°F

## 2022-12-06 DIAGNOSIS — R2232 Localized swelling, mass and lump, left upper limb: Secondary | ICD-10-CM | POA: Insufficient documentation

## 2022-12-06 NOTE — Progress Notes (Signed)
Pt was taken back to the Ambulatory Procedure Room after time out was performed and informed consent was obtained.  Prepped and draped in normal sterile surgical fashion.  After instillation of local anesthesia, a 3.1-4.0cm elliptical incision around the arm mass was made and carried down to the subcutaneous tissue using sharp dissection .  Skin flaps were raised superiorly and inferiorly with the aid of retraction instruments. Circumferential dissection of the mass was done down to the musculofascial plane and then transected at the base and passed off for permanent section.  The area was then irrigated and hemostasis was established with cautery . The wound was closed using 3-0 nylon suture. A dry sterile dressing was applied and wound care instructions were given.  The patient will follow-up in 2 weeks for discussion of pathology and removal of stitches.

## 2022-12-07 ENCOUNTER — Encounter (HOSPITAL_BASED_OUTPATIENT_CLINIC_OR_DEPARTMENT_OTHER): Payer: Self-pay | Admitting: Surgery

## 2022-12-07 LAB — SURGICAL PATH SPECIMEN SOFT TISSUE

## 2022-12-19 ENCOUNTER — Other Ambulatory Visit: Payer: Self-pay

## 2022-12-19 ENCOUNTER — Ambulatory Visit: Payer: Medicare HMO | Attending: Podiatrist | Admitting: Podiatrist

## 2022-12-19 DIAGNOSIS — M2011 Hallux valgus (acquired), right foot: Secondary | ICD-10-CM | POA: Diagnosis present

## 2022-12-19 NOTE — Progress Notes (Signed)
Podiatric Clinic Progress Note    CC: Right foot bunion evaluation    Subjective: Denise Gutierrez is a 67 year old female who presents for evaluation of a right foot bunion.  Patient was previously treated in clinic for left second digit hammertoe with flexor tenotomy that has remained healed without issue.  Patient relays she feels intermittent acid like pain to her right foot on the outside for up to 30 minutes at a time about twice a week.  Pain is unrelated to wearing shoes, standing, or activity.  Pain sometimes occurs at night.  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  Patient relays she had a Lapidus performed to her left foot more than 6 years ago by an outside provider.  She did have an unfortunate reaction with the titanium plates which needed to be removed as well as an additional revisional surgery.  Patient is retired and remains active with frequent trips.  She does emphasize importance to her of driving long distances.  No other pedal complaints.    ROS: All systems reviewed and negative except per HPI.    Objective:  Physical exam:  General: Alert and oriented x3. Pleasant with no acute distress. Nontoxic appearance.  Vasc: Pedal pulses palpable bilaterally.  Capillary refill immediate to distal digits.  Skin after gradient symmetrical and within normal limits.  No peripheral edema noted.  Neuro: Gross and light touch sensation symmetrical and intact.  Negative Tinel's to pedal nerves, right foot.  Derm: Fully epithelialized surgical cicatrix to left lower extremity at TMT joint.  Skin supple with normal turgor.  No erythema, edema, or ecchymosis noted.  No preulcerative lesions noted.  MSK: Tracking hallux abductovalgus deformity, right foot with dorsomedial prominence.  No crepitus or pain on full ranging of metatarsophalangeal joint.  Mild hypermobility of first ray.  No tenderness on ranging of STJ or ankle, right.  Left hallux remains in good anatomic alignment.  Left  second digit remains in good anatomic alignment. Dorsiflexion of ankles to perpendicular with knees flexed and extended. Standing: Mild heel eversion on RCSP of right lower extremity. Rectus to LLE. Mild navicular drop to RLE. Gait: Nonantalgic with some medial arch collapse during stance, RLE. No hallux toe purchase to LLE on toe off.     Assessment:  HAV deformity, RLE  Neuritis to RLE dorsomedial prominence    Plan:  -Patient was seen and evaluated.  -Discussed etiology of bunion and nerve-like symptoms to the RLE likely caused by compression of a digital nerve by the dorsomedial prominence. Patient does have pain twice a week that will possibly worsen over time given progressive deformity. Discussed risks, benefits, and expected outcomes of a Lapidus via Lapiplasty technique including 6 weeks post-operatively before patient can drive comfortably again. Patient understands that her nerve pain may not fully resolve with a Lapiplasty. Patient will think about it as she does have several plans this winter and will contact clinic at least 1 month in advance of any surgical planning for xrays if she decides to proceed with Lapiplasty. She will continue with supportive care including shoes with wide toe boxes.  -PRN or sooner should symptoms occur.      Janeece Agee, DPM, PGY-2    A majority of this note has been dictated with a voice recognition system. Occasional wrong-word or "sound-A-like" substitutions may have occurred due to the inherent limitations of voice recognition software. Read the chart carefully and recognize, using context, where substitutions have occurred.  Please excuse any identified errors in spelling or syntax. Every effort has been made to appropriately edit and correct upon completion.

## 2022-12-20 ENCOUNTER — Encounter (HOSPITAL_BASED_OUTPATIENT_CLINIC_OR_DEPARTMENT_OTHER): Payer: Self-pay | Admitting: Registered Nurse

## 2022-12-20 ENCOUNTER — Ambulatory Visit: Payer: Medicare HMO | Attending: Surgery | Admitting: Registered Nurse

## 2022-12-20 ENCOUNTER — Other Ambulatory Visit: Payer: Self-pay

## 2022-12-20 DIAGNOSIS — R2232 Localized swelling, mass and lump, left upper limb: Secondary | ICD-10-CM | POA: Insufficient documentation

## 2022-12-20 NOTE — Progress Notes (Signed)
SUTURE / STAPLE REMOVAL PROCEDURE NOTE    Indication: Wound healed    Procedure: The patient was placed in the appropriate position and the sutures were removed without difficulty.    Other items: the wound appears well healed    The patient tolerated the procedure well.    Complications: None

## 2022-12-22 NOTE — Progress Notes (Signed)
I have discussed the patient's case with the resident. Patient was personally seen by me..  I have reviewed the patient's medical history, the resident's findings on physical examination, and the patient's diagnosis and treatment plan. I agree with the documentation as written by the resident.     Tillman Sers DPM, FACFAS     Podiatric Clinic Progress Note    CC: Right foot bunion evaluation    Subjective: Denise Gutierrez is a 67 year old female who presents for evaluation of a right foot bunion.  Patient was previously treated in clinic for left second digit hammertoe with flexor tenotomy that has remained healed without issue.  Patient relays she feels intermittent acid like pain to her right foot on the outside for up to 30 minutes at a time about twice a week.  Pain is unrelated to wearing shoes, standing, or activity.  Pain sometimes occurs at night.  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  Patient relays she had a Lapidus performed to her left foot more than 6 years ago by an outside provider.  She did have an unfortunate reaction with the titanium plates which needed to be removed as well as an additional revisional surgery.  Patient is retired and remains active with frequent trips.  She does emphasize importance to her of driving long distances.  No other pedal complaints.    ROS: All systems reviewed and negative except per HPI.    Objective:  Physical exam:  General: Alert and oriented x3. Pleasant with no acute distress. Nontoxic appearance.  Vasc: Pedal pulses palpable bilaterally.  Capillary refill immediate to distal digits.  Skin after gradient symmetrical and within normal limits.  No peripheral edema noted.  Neuro: Gross and light touch sensation symmetrical and intact.  Negative Tinel's to pedal nerves, right foot.  Derm: Fully epithelialized surgical cicatrix to left lower extremity at TMT joint.  Skin supple with normal turgor.  No erythema, edema, or ecchymosis  noted.  No preulcerative lesions noted.  MSK: Tracking hallux abductovalgus deformity, right foot with dorsomedial prominence.  No crepitus or pain on full ranging of metatarsophalangeal joint.  Mild hypermobility of first ray.  No tenderness on ranging of STJ or ankle, right.  Left hallux remains in good anatomic alignment.  Left second digit remains in good anatomic alignment. Dorsiflexion of ankles to perpendicular with knees flexed and extended. Standing: Mild heel eversion on RCSP of right lower extremity. Rectus to LLE. Mild navicular drop to RLE. Gait: Nonantalgic with some medial arch collapse during stance, RLE. No hallux toe purchase to LLE on toe off.     Assessment:  HAV deformity, RLE  Neuritis to RLE dorsomedial prominence    Plan:  -Patient was seen and evaluated.  -Discussed etiology of bunion and nerve-like symptoms to the RLE likely caused by compression of a digital nerve by the dorsomedial prominence. Patient does have pain twice a week that will possibly worsen over time given progressive deformity. Discussed risks, benefits, and expected outcomes of a Lapidus via Lapiplasty technique including 6 weeks post-operatively before patient can drive comfortably again. Patient understands that her nerve pain may not fully resolve with a Lapiplasty. Patient will think about it as she does have several plans this winter and will contact clinic at least 1 month in advance of any surgical planning for xrays if she decides to proceed with Lapiplasty. She will continue with supportive care including shoes with wide toe boxes.  -PRN or  sooner should symptoms occur.      Janeece Agee, DPM, PGY-2    A majority of this note has been dictated with a voice recognition system. Occasional wrong-word or "sound-A-like" substitutions may have occurred due to the inherent limitations of voice recognition software. Read the chart carefully and recognize, using context, where substitutions have occurred. Please excuse any  identified errors in spelling or syntax. Every effort has been made to appropriately edit and correct upon completion.

## 2023-01-03 ENCOUNTER — Ambulatory Visit (HOSPITAL_BASED_OUTPATIENT_CLINIC_OR_DEPARTMENT_OTHER): Payer: Medicare HMO | Admitting: Surgery

## 2023-01-15 ENCOUNTER — Encounter (HOSPITAL_BASED_OUTPATIENT_CLINIC_OR_DEPARTMENT_OTHER): Payer: Self-pay | Admitting: Student in an Organized Health Care Education/Training Program

## 2023-01-15 ENCOUNTER — Ambulatory Visit
Payer: Medicare HMO | Attending: Student in an Organized Health Care Education/Training Program | Admitting: Student in an Organized Health Care Education/Training Program

## 2023-01-15 ENCOUNTER — Other Ambulatory Visit: Payer: Self-pay

## 2023-01-15 VITALS — BP 135/82 | HR 82 | Ht 68.0 in

## 2023-01-15 DIAGNOSIS — F411 Generalized anxiety disorder: Secondary | ICD-10-CM | POA: Diagnosis present

## 2023-01-15 DIAGNOSIS — M791 Myalgia, unspecified site: Secondary | ICD-10-CM | POA: Insufficient documentation

## 2023-01-15 DIAGNOSIS — R196 Halitosis: Secondary | ICD-10-CM | POA: Diagnosis present

## 2023-01-15 DIAGNOSIS — R030 Elevated blood-pressure reading, without diagnosis of hypertension: Secondary | ICD-10-CM | POA: Insufficient documentation

## 2023-01-15 DIAGNOSIS — E7849 Other hyperlipidemia: Secondary | ICD-10-CM | POA: Diagnosis not present

## 2023-01-15 DIAGNOSIS — R12 Heartburn: Secondary | ICD-10-CM | POA: Insufficient documentation

## 2023-01-15 DIAGNOSIS — R5383 Other fatigue: Secondary | ICD-10-CM | POA: Insufficient documentation

## 2023-01-15 DIAGNOSIS — R400 Somnolence: Secondary | ICD-10-CM | POA: Insufficient documentation

## 2023-01-15 DIAGNOSIS — L700 Acne vulgaris: Secondary | ICD-10-CM | POA: Diagnosis present

## 2023-01-15 DIAGNOSIS — R7303 Prediabetes: Secondary | ICD-10-CM | POA: Insufficient documentation

## 2023-01-15 LAB — HEPATIC FUNCTION PANEL
ALANINE AMINOTRANSFERASE: 31 U/L (ref 12–45)
ALBUMIN: 4.2 g/dL (ref 3.4–5.2)
ALKALINE PHOSPHATASE: 116 U/L (ref 45–117)
ASPARTATE AMINOTRANSFERASE: 35 U/L — ABNORMAL HIGH (ref 8–34)
BILIRUBIN DIRECT: <0.2 mg/dL (ref 0.0–0.2)
BILIRUBIN TOTAL: 0.3 mg/dL (ref 0.2–1.0)
TOTAL PROTEIN: 6.5 g/dL (ref 6.4–8.2)

## 2023-01-15 LAB — VITAMIN D,25 HYDROXY: VITAMIN D,25 HYDROXY: 36 ng/mL (ref 30.0–100.0)

## 2023-01-15 LAB — CBC, PLATELET & DIFFERENTIAL
ABSOLUTE BASO COUNT: 0.1 10*3/uL (ref 0.0–0.1)
ABSOLUTE EOSINOPHIL COUNT: 0.3 10*3/uL (ref 0.0–0.8)
ABSOLUTE IMM GRAN COUNT: 0.02 10*3/uL (ref 0.00–0.10)
ABSOLUTE LYMPH COUNT: 2.2 10*3/uL (ref 0.6–5.9)
ABSOLUTE MONO COUNT: 0.5 10*3/uL (ref 0.2–1.4)
ABSOLUTE NEUTROPHIL COUNT: 5.2 10*3/uL (ref 1.6–8.3)
ABSOLUTE NRBC COUNT: 0 10*3/uL (ref 0.0–0.0)
BASOPHIL %: 0.7 % (ref 0.0–1.2)
EOSINOPHIL %: 3.2 % (ref 0.0–7.0)
HEMATOCRIT: 43.7 % (ref 34.1–44.9)
HEMOGLOBIN: 13.6 g/dL (ref 11.2–15.7)
IMMATURE GRANULOCYTE %: 0.2 % (ref 0.0–1.0)
LYMPHOCYTE %: 26.8 % (ref 15.0–54.0)
MEAN CORP HGB CONC: 31.1 g/dL (ref 31.0–37.0)
MEAN CORPUSCULAR HGB: 26.8 pg (ref 26.0–34.0)
MEAN CORPUSCULAR VOL: 86.2 fL (ref 80.0–100.0)
MEAN PLATELET VOLUME: 12.3 fL (ref 8.7–12.5)
MONOCYTE %: 6.3 % (ref 4.0–13.0)
NEUTROPHIL %: 62.8 % (ref 40.0–75.0)
NRBC %: 0 % (ref 0.0–0.0)
PLATELET COUNT: 294 10*3/uL (ref 150–400)
RBC DISTRIBUTION WIDTH STD DEV: 46.9 fL — ABNORMAL HIGH (ref 35.1–46.3)
RED BLOOD CELL COUNT: 5.07 M/uL (ref 3.90–5.20)
WHITE BLOOD CELL COUNT: 8.4 10*3/uL (ref 4.0–11.0)

## 2023-01-15 LAB — VITAMIN B12: VITAMIN B12: 1000 pg/mL (ref 232–1245)

## 2023-01-15 LAB — BASIC METABOLIC PANEL
ANION GAP: 9 mmol/L — ABNORMAL LOW (ref 10–22)
BUN (UREA NITROGEN): 25 mg/dL — ABNORMAL HIGH (ref 7–18)
CALCIUM: 9.9 mg/dL (ref 8.5–10.5)
CARBON DIOXIDE: 27 mmol/L (ref 21–32)
CHLORIDE: 104 mmol/L (ref 98–107)
CREATININE: 0.7 mg/dL (ref 0.4–1.2)
ESTIMATED GLOMERULAR FILT RATE: >60 mL/min (ref >60)
Glucose Random: 114 mg/dL (ref 74–160)
POTASSIUM: 4.5 mmol/L (ref 3.5–5.1)
SODIUM: 139 mmol/L (ref 136–145)

## 2023-01-15 LAB — CREATINE KINASE TOTAL: CREATINE KINASE TOTAL: 735 U/L — ABNORMAL HIGH (ref 26–192)

## 2023-01-15 LAB — HEMOGLOBIN A1C
ESTIMATED AVERAGE GLUCOSE: 117 mg/dL (ref 74–160)
HEMOGLOBIN A1C: 5.7 % — ABNORMAL HIGH (ref 4.0–5.6)

## 2023-01-15 MED ORDER — MUPIROCIN 2 % EX OINT
TOPICAL_OINTMENT | Freq: Two times a day (BID) | CUTANEOUS | 0 refills | Status: AC
Start: 2023-01-15 — End: 2023-01-22

## 2023-01-15 NOTE — Progress Notes (Signed)
PRIMARY CARE NOTE - FOLLOW-UP VISIT     SUBJECTIVE  Denise Gutierrez is a 67 year old English speaking female here for:    Chief concern(s):     Transferred primary care July 2024   First visit discussed- shin pain (right), anxiety, halitosis -- noted BP to be elevated, elected to monitor closely   - saw surgery for soft tissue mass excision (left forearm lipoma removal)   - saw podiatry for hallux valgus (right)     Today pt reports:   Worsening mood  Feeling more depressed   Sleep is worse     Spot on right chest appeared 1 week ago   Itching  Also a little painful   No drainage   Mammogram in February was normal   Has not tried anything to relieve the spot     Also has been having more generalized aches and pains - headaches, neck pain, nausea, throat is irritated -- very worried about these symptoms   Shoulders, knees, feet hurt   Headaches are worse in the morning when laying in bed  She feels tired when she wakes up in the morning   More fatigue   She does snore     Tried Lipitor, had diarrhea, stopped when she stopped Lipitor  Switched to [employer] - felt better, continuing   The 10-year ASCVD risk score (Arnett DK, et al., 2019) is: 6.9%    Values used to calculate the score:      Age: 24 years      Sex: Female      Is Non-Hispanic African American: No      Diabetic: No      Tobacco smoker: No      Systolic Blood Pressure: 135 mmHg      Is BP treated: No      HDL Cholesterol: 63 mg/dL      Total Cholesterol: 178 mg/dL      Was just in [place] - niece had a baby  Going on a [place] cruise next month       ROS - Pertinent symptoms as noted above, other ROS negative    Active Medical Issues:  Patient Active Problem List:     Elevated BP without diagnosis of hypertension     Snoring     Toenail deformity     Insomnia     Major depressive disorder, recurrent, mild (HCC)     GAD (generalized anxiety disorder)     Other hyperlipidemia     Right knee pain     Stiffness of hand joint     Neck pain     Left knee pain     Left  hip pain     Left shoulder pain     Elevated hemoglobin A1c     Allergic rhinitis    Past medical, surgical, and social history reviewed. Allergies reviewed.     Medications reviewed:  Current Outpatient Medications   Medication Instructions    ascorbic acid (VITAMIN C) 500 mg, Oral, DAILY    escitalopram (LEXAPRO) 10 mg, Oral, DAILY    Multiple Vitamins-Minerals (THERA-M) TABS 1 tablet, Oral, DAILY    mupirocin (BACTROBAN) 2 % ointment Topical, 2 TIMES DAILY    omeprazole (PRILOSEC) 20 mg, Oral, DAILY    rosuvastatin (CRESTOR) 5 mg, Oral, DAILY    VITAMIN D PO 600 mg, Oral, DAILY     OBJECTIVE  BP 135/82 (Site: RA, Position: Sitting, Cuff Size: Reg)   Pulse 82  Ht 5\' 8"  (1.727 m)   SpO2 99%   BMI 26.15 kg/m     Physical Exam  Non toxic appearing   Pupils symmetric, round, equal reactive  Tight paraspinal muscles in cervical region and trapezius  Shoulders non tender   No swelling or tenderness of elbows, wrists, fingers or knees   On right chest there is a papule that is about 1 cm, above breast, that is tender, no induration, no surrounding erythema -- similar in appearance to open comedo   No palpable breast lumps or axillary lymphadenopathy   Normal work of breathing   Heart RRR, no murmurs   Skin is warm   No LE edema       I personally reviewed relevant labs, imaging, and studies in EpicCare.   - brain MRI May 2024 - reviewed, no acute findings  - mammogram 2024 - normal       ASSESSMENT AND PLAN  Denise Gutierrez is a 67 year old female who presents with:    1. Elevated blood pressure reading without diagnosis of hypertension  Continue to monitor closely, check sleep study, if BP does not improve would discuss pharmacotherapy for HTN at next visit   - BASIC METABOLIC PANEL    2. Myalgia  Broad ddx  Pt is not as active as she used to be and suspect this is a significant contributor   She is going to focus on more activity   Will also pursue lab eval - imaging not currently indicated   - CREATINE KINASE  TOTAL  - ANA SCREEN WITH REFLEX  - CBC, PLATELET & DIFFERENTIAL  - HEPATIC FUNCTION PANEL    3. GAD (generalized anxiety disorder)  Discussed increasing SSRI or trialing another agent, pt declines for now     4. Other hyperlipidemia  Continue statin.   She switched from Lipitor to Crestor with improved symptoms  Statin myopathy is not the most likely cause of her symptoms, but if no other cause ID'd we can try adding CoQ10    5. Pre-diabetes  Could be contributing to current symptoms   - HEMOGLOBIN A1C    6. Daytime somnolence  Pt has several risk factors for OSA including HTN; she has family hx OSA. She has daytime somnolence. Proceed to sleep study   - SLEEP STUDY; Future    7. Open comedone  Suspect this is an open comedone - no sign of overlying infection  Discussed referral now for evaluation for extraction vs conservative mgmt, pt prefers conservative mgmt, start warm compresses 3-5 times per day   - mupirocin (BACTROBAN) 2 % ointment; Apply topically 2 (two) times daily  for 7 days  Dispense: 22 g; Refill: 0    8. Heartburn  9. Halitosis  Hpylori negative - symptoms have not improved with trial of ppi  She is not taking asa or nsaids   Given ongoing symptoms of heartburn and bothersome halitosis, will proceed to EGD  - EGD; Future    10. Other fatigue  - VITAMIN B12  - VITAMIN D,25 HYDROXY      Follow up in approximately 3-4 mos. ER and return precautions reviewed.     I spent a total of 35 minutes on this visit on the date of service (total time includes all activities performed on the date of service)  ____________    Verlin Dike, DO    *If medications were prescribed today, we discussed Desirie Figg's new and current medications, including risks, benefits, and  potential side effects. The patient expressed understanding and no barriers to adherence were identified.  Any tests ordered today were discussed with the patient.  The patient expressed understanding with plan.    Time spent on the day of the  visit on some or all of the following activities:   Preparing to see the patient (eg, review of tests)   Obtaining and/or reviewing separately obtained history   Performing a medically appropriate examination and/or evaluation   Counseling and educating the patient/family/caregiver   Ordering medications, tests, or procedures   Referring and communicating with other health care professionals    Documenting clinical information in the electronic or other health record   Independently interpreting results and  communicating results to the patient/family/caregiver   Care coordination

## 2023-01-15 NOTE — Patient Instructions (Addendum)
You will be contacted for the sleep study:   Your provider has placed a request for you to undergo a sleep study.  We are currently working with your health insurance carrier to obtain approval for the test.  This authorization process can take 6-8 weeks. Once approved by your insurance, the Sleep Lab will make two attempts to contact you to schedule your sleep study.  You will need to speak directly with the Sleep Lab both to schedule the test and also to go over the directions for the study.  Please respond to the phone call or voice mail promptly.  The phone number for the Sleep Lab is [phone]. Thank you.     You will be contacted to schedule the endoscopy     Warm compresses for 10-20 minutes, 3-5 times per day. Can apply the mupirocin ointment to the spot afterwards. Call if no improvement in 1 week.

## 2023-01-16 ENCOUNTER — Encounter (HOSPITAL_BASED_OUTPATIENT_CLINIC_OR_DEPARTMENT_OTHER): Payer: Self-pay

## 2023-01-16 LAB — ANA POSITIVE ADD ON
CENTROMERE B AB: <0.2 AI (ref 0.0–0.9)
CHROMATIN AB: <0.2 AI (ref 0.0–0.9)
DOUBLE STRANDED DNA AB: 1 [IU]/mL (ref 0–4)
JO-1 ANTIBODY: <0.2 AI (ref 0.0–0.9)
RIBONUCLEOPROTEIN AB: 1.4 AI — ABNORMAL HIGH (ref 0.0–0.9)
RIBOSOMAL P AB: <0.2 AI (ref 0.0–0.9)
SCLERODERMA 70 AB: <0.2 AI (ref 0.0–0.9)
SJOGREN'S ANTIBODY SS-A: <0.2 AI (ref 0.0–0.9)
SJOGREN'S ANTIBODY SS-B: <0.2 AI (ref 0.0–0.9)
SMITH AND RIBONUCLEOPROTEIN AB: <0.2 AI (ref 0.0–0.9)
SMITH ANTIBODY: <0.2 AI (ref 0.0–0.9)

## 2023-01-16 LAB — ANA SCREEN WITH REFLEX: ANTINUCLEAR ANTIBODY SCREEN: POSITIVE — AB

## 2023-02-01 ENCOUNTER — Encounter (HOSPITAL_BASED_OUTPATIENT_CLINIC_OR_DEPARTMENT_OTHER): Payer: Self-pay

## 2023-02-01 ENCOUNTER — Other Ambulatory Visit (HOSPITAL_BASED_OUTPATIENT_CLINIC_OR_DEPARTMENT_OTHER): Payer: Self-pay

## 2023-02-01 ENCOUNTER — Encounter (HOSPITAL_BASED_OUTPATIENT_CLINIC_OR_DEPARTMENT_OTHER): Payer: Self-pay | Admitting: Student in an Organized Health Care Education/Training Program

## 2023-02-01 MED ORDER — PEG 3350-KCL-NABCB-NACL-NASULF 236 G PO SOLR
4.0000 L | ORAL | 0 refills | Status: DC
Start: 2023-02-01 — End: 2023-03-08

## 2023-02-01 MED ORDER — SIMETHICONE 80 MG PO TABS
4.0000 | ORAL_TABLET | ORAL | 0 refills | Status: DC
Start: 2023-02-01 — End: 2023-03-08

## 2023-02-01 NOTE — Progress Notes (Signed)
ANA positive - slight elevation ribonucleoprotein, will repeat   CK significantly elevated - we will consider stopping statin   bmp normal   AST 35  a1c stable 5.7  cbc normal   normal vit d and b12 levels

## 2023-02-01 NOTE — Telephone Encounter (Signed)
Returned call to  patient and left v/m to confirm colonoscopy procedure for  03/08/23.   Reminded of need for ride home.  SD1 Prep ordered, instructions sent in my chart. No SGLT2, GLP-1 or blood thinners noted on chart.

## 2023-02-06 ENCOUNTER — Telehealth (HOSPITAL_BASED_OUTPATIENT_CLINIC_OR_DEPARTMENT_OTHER): Payer: Self-pay

## 2023-02-06 NOTE — Telephone Encounter (Signed)
Planned Care Outreach  Call made to [facility] to schedule an appointment  BP f/u . No interpreter needed.   Patient (self) did not answer at [phone] ; this team member left a message asking for a call back.  I have completed the following communication and reminder steps: Future InBasket message to myself  Marden Noble, 02/06/2023    On behalf of PCP: Verlin Dike, DO

## 2023-03-01 ENCOUNTER — Encounter (HOSPITAL_BASED_OUTPATIENT_CLINIC_OR_DEPARTMENT_OTHER): Payer: Self-pay

## 2023-03-01 NOTE — Progress Notes (Signed)
PRIMARY CARE NOTE - FOLLOW-UP VISIT     SUBJECTIVE  Denise Gutierrez is a 67 year old English speaking female here for:    Chief concern(s): Follow-up    Chart review:  Last OV 10/22, pt had not been feeling well, had generalized myalgias   CK was elevated to 735, stopped statin   Also planned to evaluation for OSA w sleep study given daytime somnolence   Ordered EEG for ongoing heartburn despite PPI     Today patient reports:  Was on a cruise in the [place] - went with her brother, had a great time     Doing well overall     Aches and pains are much better   She stopped taking statin, last dose 5 weeks ago     Mood is good, stopped Lexapro     Sleep is better   But she is concerned about OSA - her new [employer] watch is picking up "disordered sleep breathing"    She continues to have throat irritation and some bad breath   We are pursuing an endoscopy - later this week       Most Recent BP Reading(s)  03/08/23 : 113/71  03/06/23 : 136/80  01/15/23 : 135/82  12/06/22 : 143/87  10/25/22 : 143/88    Most Recent Weight Reading(s)  03/08/23 : 77.6 kg (171 lb)  03/06/23 : 79.4 kg (175 lb)  07/25/22 : 78 kg (172 lb)  06/21/22 : 78.5 kg (173 lb)  04/30/22 : 77.6 kg (171 lb)      ROS - Pertinent symptoms as noted above, other ROS negative    Active Medical Issues:  Patient Active Problem List:     Elevated BP without diagnosis of hypertension     Snoring     Toenail deformity     Insomnia     Major depressive disorder, recurrent, mild (HCC)     GAD (generalized anxiety disorder)     Other hyperlipidemia     Right knee pain     Stiffness of hand joint     Neck pain     Left knee pain     Left hip pain     Left shoulder pain     Elevated hemoglobin A1c     Allergic rhinitis    Past medical, surgical, and social history reviewed. Allergies reviewed.     Medications reviewed:  Current Outpatient Medications   Medication Instructions    ascorbic acid (VITAMIN C) 500 mg, DAILY    escitalopram (LEXAPRO) 10 mg, Oral, DAILY    fluticasone  (FLONASE) 50 MCG/ACT nasal spray SPRAY 1 SPRAY INTO EACH NOSTRIL IN THE MORNING    Multiple Vitamins-Minerals (THERA-M) TABS 1 tablet, DAILY    pantoprazole (PROTONIX) 40 mg, Oral, DAILY    rosuvastatin (CRESTOR) 5 mg, Oral, DAILY    VITAMIN D PO 600 mg, DAILY       OBJECTIVE  BP 136/80   Pulse 70   Temp 97.4 F (36.3 C) (Oral)   Ht 5\' 8"  (1.727 m)   Wt 79.4 kg (175 lb)   SpO2 98%   BMI 26.61 kg/m     Physical Exam  Gen: well appearing, pleasant, nad  Mmm, no scleral icterus  Breathing comfortably  Skin wwp  Gait normal   Appropriate mood and affect      I personally reviewed relevant labs, imaging, and studies in EpicCare.     ASSESSMENT AND PLAN  [name] is a 67  year old female who presents with:    1. Myalgia  2. Elevated CK  CK elevated last visit, pt feeling better after stopping statin  Will check ck again today   - CYCLIC CITRULLIN PEPTIDE IGG  - RHEUMATOID FACTOR  - ANA SCREEN WITH REFLEX  - URINALYSIS RFLX TO URINE CULT    3. Patient has healthcare proxy  - HEALTH CARE PROXY    4. Elevated blood pressure reading without diagnosis of hypertension  BP remains borderline, continue to monitor, pt prefers to focus on improving lifestyle rather than starting new medication   - BASIC METABOLIC PANEL    5. Other hyperlipidemia  Need to update lipids off statin, based on lipids will make a plan for adjusting regimen   - LIPID PANEL  - HEPATIC FUNCTION PANEL    6. GAD (generalized anxiety disorder)  Stable  Stopped Lexapro   Monitor for re-emerging symptoms. Mood is currently good     7. Elevated alkaline phosphatase level  - LAB ADD ON/WRITE IN TEST      Follow up in approximately 3 mos. ER and return precautions reviewed.     I spent a total of 35 minutes on this visit on the date of service (total time includes all activities performed on the date of service)  ____________    Verlin Dike, DO    *If medications were prescribed today, we discussed Lynsay Nugent's new and current medications, including  risks, benefits, and potential side effects. The patient expressed understanding and no barriers to adherence were identified.  Any tests ordered today were discussed with the patient.  The patient expressed understanding with plan.    Time spent on the day of the visit on some or all of the following activities:   Preparing to see the patient (eg, review of tests)   Obtaining and/or reviewing separately obtained history   Performing a medically appropriate examination and/or evaluation   Counseling and educating the patient/family/caregiver   Ordering medications, tests, or procedures   Referring and communicating with other health care professionals    Documenting clinical information in the electronic or other health record   Independently interpreting results and  communicating results to the patient/family/caregiver   Care coordination

## 2023-03-01 NOTE — Progress Notes (Signed)
Confirmed 12/13 arrival 0845; pt understands needs to follow NPO p/ midnight.

## 2023-03-06 ENCOUNTER — Encounter (HOSPITAL_BASED_OUTPATIENT_CLINIC_OR_DEPARTMENT_OTHER): Payer: Self-pay | Admitting: Student in an Organized Health Care Education/Training Program

## 2023-03-06 ENCOUNTER — Other Ambulatory Visit: Payer: Self-pay

## 2023-03-06 ENCOUNTER — Ambulatory Visit
Payer: Medicare HMO | Attending: Student in an Organized Health Care Education/Training Program | Admitting: Student in an Organized Health Care Education/Training Program

## 2023-03-06 VITALS — BP 136/80 | HR 70 | Temp 97.4°F | Ht 68.0 in | Wt 175.0 lb

## 2023-03-06 DIAGNOSIS — F411 Generalized anxiety disorder: Secondary | ICD-10-CM | POA: Diagnosis present

## 2023-03-06 DIAGNOSIS — Z789 Other specified health status: Secondary | ICD-10-CM | POA: Diagnosis present

## 2023-03-06 DIAGNOSIS — R030 Elevated blood-pressure reading, without diagnosis of hypertension: Secondary | ICD-10-CM | POA: Diagnosis present

## 2023-03-06 DIAGNOSIS — R748 Abnormal levels of other serum enzymes: Secondary | ICD-10-CM | POA: Diagnosis present

## 2023-03-06 DIAGNOSIS — E7849 Other hyperlipidemia: Secondary | ICD-10-CM | POA: Insufficient documentation

## 2023-03-06 DIAGNOSIS — M791 Myalgia, unspecified site: Secondary | ICD-10-CM | POA: Diagnosis present

## 2023-03-06 LAB — URINALYSIS RFLX TO URINE CULT
BILIRUBIN, URINE: NEGATIVE
GLUCOSE, URINE: NEGATIVE mg/dL
KETONE, URINE: NEGATIVE mg/dL
LEUKOCYTES, URINE: NEGATIVE
NITRITE, URINE: NEGATIVE
OCCULT BLOOD, URINE: NEGATIVE
PH, URINE: 5.5 (ref 5.0–8.0)
PROTEIN, URINE: NEGATIVE mg/dL
SPECIFIC GRAVITY, URINE: 1.014 (ref 1.003–1.035)
UROBILINOGEN, URINE: 0.2 U/dL (ref 0.2–1.0)

## 2023-03-06 NOTE — Patient Instructions (Addendum)
Please call the sleep lab to schedule the sleep study:  Your provider has placed a request for you to undergo a sleep study.  We are currently working with your health insurance carrier to obtain approval for the test.  This authorization process can take 6-8 weeks. Once approved by your insurance, the Sleep Lab will make two attempts to contact you to schedule your sleep study.  You will need to speak directly with the Sleep Lab both to schedule the test and also to go over the directions for the study.  Please respond to the phone call or voice mail promptly.  The phone number for the Sleep Lab is [phone]. Thank you.

## 2023-03-07 LAB — ANA SCREEN WITH REFLEX: ANTINUCLEAR ANTIBODY SCREEN: POSITIVE — AB

## 2023-03-07 LAB — ANA POSITIVE ADD ON
CENTROMERE B AB: <0.2 AI (ref 0.0–0.9)
CHROMATIN AB: <0.2 AI (ref 0.0–0.9)
DOUBLE STRANDED DNA AB: <1 [IU]/mL (ref 0–4)
JO-1 ANTIBODY: <0.2 AI (ref 0.0–0.9)
RIBONUCLEOPROTEIN AB: 1.3 AI — ABNORMAL HIGH (ref 0.0–0.9)
RIBOSOMAL P AB: <0.2 AI (ref 0.0–0.9)
SCLERODERMA 70 AB: <0.2 AI (ref 0.0–0.9)
SJOGREN'S ANTIBODY SS-A: <0.2 AI (ref 0.0–0.9)
SJOGREN'S ANTIBODY SS-B: <0.2 AI (ref 0.0–0.9)
SMITH AND RIBONUCLEOPROTEIN AB: <0.2 AI (ref 0.0–0.9)
SMITH ANTIBODY: <0.2 AI (ref 0.0–0.9)

## 2023-03-07 LAB — BASIC METABOLIC PANEL
ANION GAP: 10 mmol/L (ref 10–22)
BUN (UREA NITROGEN): 25 mg/dL — ABNORMAL HIGH (ref 7–18)
CALCIUM: 10.2 mg/dL (ref 8.5–10.5)
CARBON DIOXIDE: 29 mmol/L (ref 21–32)
CHLORIDE: 102 mmol/L (ref 98–107)
CREATININE: 0.8 mg/dL (ref 0.4–1.2)
ESTIMATED GLOMERULAR FILT RATE: >60 mL/min (ref >60)
Glucose Random: 77 mg/dL (ref 74–160)
POTASSIUM: 4.5 mmol/L (ref 3.5–5.1)
SODIUM: 141 mmol/L (ref 136–145)

## 2023-03-07 LAB — LIPID PANEL
Cholesterol: 300 mg/dL — ABNORMAL HIGH (ref 0–239)
HIGH DENSITY LIPOPROTEIN: 67 mg/dL (ref 40–60)
LOW DENSITY LIPOPROTEIN DIRECT: 230 mg/dL — ABNORMAL HIGH (ref 0–189)
TRIGLYCERIDES: 181 mg/dL — ABNORMAL HIGH (ref 0–150)

## 2023-03-07 LAB — VITAMIN D,25 HYDROXY: VITAMIN D,25 HYDROXY: 36 ng/mL (ref 30.0–100.0)

## 2023-03-07 LAB — HEPATIC FUNCTION PANEL
ALANINE AMINOTRANSFERASE: 21 U/L (ref 12–45)
ALBUMIN: 4.3 g/dL (ref 3.4–5.2)
ALKALINE PHOSPHATASE: 122 U/L — ABNORMAL HIGH (ref 45–117)
ASPARTATE AMINOTRANSFERASE: 21 U/L (ref 8–34)
BILIRUBIN DIRECT: 0.1 mg/dL (ref 0.0–0.2)
BILIRUBIN TOTAL: 0.3 mg/dL (ref 0.2–1.0)
INDIRECT BILIRUBIN: 0.1 mg/dL — ABNORMAL LOW (ref 0.2–0.9)
TOTAL PROTEIN: 6.8 g/dL (ref 6.4–8.2)

## 2023-03-07 LAB — RHEUMATOID FACTOR: RHEUMATOID FACTOR: <10 [IU]/mL (ref 0–13)

## 2023-03-07 LAB — CYCLIC CITRULLIN PEPTIDE IGG: CYCLIC CITRULLIN PEPTIDE IgG: <0.5 U/mL (ref 0.0–2.9)

## 2023-03-07 LAB — CREATINE KINASE TOTAL: CREATINE KINASE TOTAL: 111 U/L (ref 26–192)

## 2023-03-07 LAB — GAMMA GLUTAMYL TRANSFERASE: GAMMA GLUTAMYL TRANSFERASE: 14 U/L (ref 6–71)

## 2023-03-08 ENCOUNTER — Other Ambulatory Visit (HOSPITAL_BASED_OUTPATIENT_CLINIC_OR_DEPARTMENT_OTHER): Payer: Self-pay | Admitting: Internal Medicine

## 2023-03-08 ENCOUNTER — Encounter (HOSPITAL_BASED_OUTPATIENT_CLINIC_OR_DEPARTMENT_OTHER): Payer: Self-pay

## 2023-03-08 ENCOUNTER — Encounter (HOSPITAL_BASED_OUTPATIENT_CLINIC_OR_DEPARTMENT_OTHER): Payer: Self-pay | Admitting: Anesthesiology

## 2023-03-08 ENCOUNTER — Other Ambulatory Visit: Payer: Self-pay

## 2023-03-08 ENCOUNTER — Ambulatory Visit (HOSPITAL_BASED_OUTPATIENT_CLINIC_OR_DEPARTMENT_OTHER): Payer: Self-pay | Admitting: Anesthesiology

## 2023-03-08 ENCOUNTER — Ambulatory Visit
Admission: RE | Admit: 2023-03-08 | Discharge: 2023-03-08 | Disposition: A | Discharge status: Home / Self Care | Payer: Medicare HMO | Attending: Gastroenterology | Admitting: Gastroenterology

## 2023-03-08 DIAGNOSIS — R12 Heartburn: Secondary | ICD-10-CM | POA: Diagnosis not present

## 2023-03-08 DIAGNOSIS — K219 Gastro-esophageal reflux disease without esophagitis: Secondary | ICD-10-CM | POA: Insufficient documentation

## 2023-03-08 DIAGNOSIS — K2289 Other specified disease of esophagus: Secondary | ICD-10-CM | POA: Diagnosis present

## 2023-03-08 DIAGNOSIS — R196 Halitosis: Secondary | ICD-10-CM | POA: Insufficient documentation

## 2023-03-08 DIAGNOSIS — K2970 Gastritis, unspecified, without bleeding: Secondary | ICD-10-CM | POA: Diagnosis present

## 2023-03-08 MED ORDER — PANTOPRAZOLE SODIUM 40 MG PO TBEC
40.0000 mg | DELAYED_RELEASE_TABLET | Freq: Every day | ORAL | 0 refills | Status: DC
Start: 2023-03-08 — End: 2023-06-11

## 2023-03-08 MED ORDER — PROPOFOL 200 MG/20ML IV EMUL
Freq: Once | INTRAVENOUS | Status: DC | PRN
Start: 2023-03-08 — End: 2023-03-08
  Administered 2023-03-08 (×2): 50 mg via INTRAVENOUS
  Administered 2023-03-08: 100 mg via INTRAVENOUS

## 2023-03-08 MED ORDER — PROPOFOL 500 MG/50 ML IV
INTRAVENOUS | Status: DC | PRN
Start: 2023-03-08 — End: 2023-03-08
  Administered 2023-03-08: 200 ug/kg/min via INTRAVENOUS

## 2023-03-08 NOTE — Anesthesia Preprocedure Evaluation (Signed)
Pre-Anesthetic Note  .      Patient: Denise Gutierrez is a 67 year old female      Procedure Information       Date/Time: 03/08/23 0930    Scheduled providers: Seth Bake, MD    Procedure: EGD    Diagnosis:       Heartburn [R12]      Halitosis [R19.6]    Location: [facility]            Relevant Problems   No relevant active problems           Previous Anesthetic History:   Past Surgical History:  No date: APPENDECTOMY  No date: BREAST BIOPSY; Right      Comment:  neg.  No date: BREAST CYST EXCISION  No date: BUNIONECTOMY  No date: KNEE CARTILAGE SURGERY      Comment:  left  No date: RPR 1ST INGUN HRNA PRETERM INFT RDC      Comment:  as child  No date: VAGINAL HYSTERECTOMY UTERUS 250 GM/<      Comment:  did not remove cervix - due to fibroids        Medications  Current Outpatient Medications   Medication Sig    Multiple Vitamins-Minerals (THERA-M) TABS Take 1 tablet by mouth in the morning.    ascorbic acid (VITAMIN C) 500 MG tablet Take 500 mg by mouth in the morning.    VITAMIN D PO Take 600 mg by mouth daily    polyethylene glycol (GOLYTELY) 236 g suspension Take 4,000 mLs by mouth See Admin Instructions Take as directed prior to your colonoscopy    Simethicone 80 MG TABS Take 4 tablets by mouth See Admin Instructions Take as directed prior to colonoscopy.    escitalopram (LEXAPRO) 10 MG tablet Take 1 tablet by mouth in the morning.    rosuvastatin (CRESTOR) 5 MG tablet Take 1 tablet by mouth in the morning.     No current facility-administered medications for this encounter.         Allergies:   Review of Patient's Allergies indicates:   Contrast dye [iopam*    Hives    Comment:MRI    Smoking, Alcohol, Drugs:  Social History    Tobacco Use      Smoking status: Never      Smokeless tobacco: Never    Alcohol use: Yes      Comment: 1 drink 3-4 days a week      Drug use:         Comment:   once every 2-3 weeks, edible and vaping          PMHx:  Past Medical History:  No date: Depression  No  date: Elevated cholesterol  No date: HTN (hypertension)    Vitals  BP 138/78   Pulse 62   Temp 98 F (36.7 C) (Temporal)   Resp 9   Ht 5\' 8"  (1.727 m)   Wt 77.6 kg (171 lb)   SpO2 97%   BMI 26.00 kg/m     Review of Systems     Patient summary reviewed and Nursing notes reviewed      Anesthetic History:   negative anesthesia history ROS           Cardiovascular:  Positive for hypertension.   Physical Activity in METs greater than 4    Pulmonary: Negative for asthma and tobacco use.       Physical Exam:  General     Level of consciousness:  Alert and oriented (time, person, place)   no respiratory distress syndrome     BMI       Airway     Mallampati:  II    TM distance:  >3 FB    Mouth opening:  >3 FB    Neck ROM:  Full     Teeth  - normal exam  }     Heart  - normal exam       Lungs - normal exam          HEENT:      Abdomen:                            Pertinent Labs:   Lab Results   Component Value Date    NA 141 03/06/2023    K 4.5 03/06/2023    CREAT 0.8 03/06/2023    GLUCOSER 77 03/06/2023    WBC 8.4 01/15/2023    HCT 43.7 01/15/2023    PLTA 294 01/15/2023    PT 11.0 09/20/2021    APTT 32.0 09/20/2021    INR 1.0 09/20/2021       EKG:   No results found for this or any previous visit.    PFTs:   No results found for this or any previous visit.      Anesthesia Plan    ASA Score:     ASA:  2    Airway:      Mallampati:  II    Mouth opening:  >3 FB    Neck ROM:  Full    TM distance:  >3 FB     Plan: TIVA    Other information:     Full Stomach Precaution:: No      Post-Plan::  PACU    Anesthesia Assessment and Plan:        NPO     Informed Consent:     Anesthetic plan and risks discussed with:  Patient   Patient Consented        Attending Anesthesiologist Statement:     Reassessed day of surgery: Yes        Assessment made, necessary equipment and appropriate plan in place.

## 2023-03-08 NOTE — Anesthesia Postprocedure Evaluation (Signed)
Anesthesia Post-Operative Evaluation Note    Patient: Denise Gutierrez           Procedure Summary       Date: 03/08/23 Room / Location: [facility]    Anesthesia Start: 1003 Anesthesia Stop:     Procedure: EGD Diagnosis:       Heartburn      Halitosis    Scheduled Providers: Seth Bake, MD Responsible Provider: Berton Mount, MD    Anesthesia Type: TIVA ASA Status: 2              POST-OPERATIVE EVALUATION    Anesthesia Post Evaluation    Vitals signs in patient's normal range: Yes  Respiratory function stable; airway patent: Yes  Cardiovascular function stable: Yes  Hydration status stable: Yes  Mental status recovered; patient participates in evaluation and/or is at baseline: Yes  Pain control satisfactory: Yes  Nausea and vomiting control satisfactory: YesProcedure was labor & delivery no  PostOP disposition PACU  Anesthesia Observation no significant observation    MIPS#404 Anesthesiology Smoking Abstinence:     The patient is a current smoker (e.g. cigarette, cigar, pipe, e-cigarette/vaping/marijuana) (GN562): No   FOR CODING USE ONLY: IF BLANK Z3086    MIPS#477 Multimodal Pain Management:  Emergent - Exclusion case: No  Patient was administered multimodal pain management (two or more drugs and/or interventions excluding systemic opioids) in the perioperative period; occurring at some time between 6 hours prior to anesthesia start time until discharged from: No   List medical reason(s) patient did not receive multimodal pain management (V7846): no pain anticipated postop  FOR CODING USE ONLY: REASON NOT LISTED N6295    MWUX#324 Perioperative Temperature Management:  Anesthesia start to Anesthesia end time was 60 minutes or longer (4256F): No   FOR CODING USE ONLY: REASON NOT LISTED M0102    MIPS#430 Adult Prevention of PONV:  Patient received an inhalational anesthetic (VO536): No  FOR CODING USE ONLY: REASON NOT LISTED U4403    MIPS#463 Pediatric Prevention of PONV:  Pediatric patient?:  No  FOR CODING USE ONLY: REASON NOT LISTED K7425        [employer] # [id number]          Last vitals    BP: 113/71 (03/08/2023 10:23 AM)  Temp: 98 F (36.7 C) (03/08/2023 10:24 AM)  Pulse: 67 (03/08/2023 10:33 AM)  Resp: 25 (03/08/2023 10:33 AM)  SpO2: 99 % (03/08/2023 10:33 AM)

## 2023-03-08 NOTE — H&P (Signed)
H&P NOTE     Chief Complaint:   GERD    PHI:  This 67 year old year old English speaking patient who presents for an EGD.    The patient denies any symptoms currently.  She has had no dysphagia, melena, or weight loss. +GERD. Mild lower abdominal discomfort. +bad breath. Tried pepcid and omeprazole, which did not help.    Hysterectomy, Appendectomy.   No Fhx of GI malignancies  Mobic and Naproxen  No blood thinners    Patient Active Problem List:     Elevated BP without diagnosis of hypertension     Snoring     Toenail deformity     Insomnia     Major depressive disorder, recurrent, mild (HCC)     GAD (generalized anxiety disorder)     Other hyperlipidemia     Right knee pain     Stiffness of hand joint     Neck pain     Left knee pain     Left hip pain     Left shoulder pain     Elevated hemoglobin A1c     Allergic rhinitis    Review of Patient's Allergies indicates:   Contrast dye [iopam*    Hives    Comment:MRI       Social History     Socioeconomic History    Marital status: Single     Spouse name: Not on file    Number of children: Not on file    Years of education: Not on file    Highest education level: Not on file   Occupational History    Not on file   Tobacco Use    Smoking status: Never    Smokeless tobacco: Never   Substance and Sexual Activity    Alcohol use: Yes     Comment: 1 drink 3-4 days a week    Drug use: Yes     Comment: once every 2-3 weeks, edible and vaping    Sexual activity: Not on file   Other Topics Concern    Not on file   Social History Narrative    July 2024    Had her son age 89    Values her independence, has lived by herself for a long time     Retired since 2020.     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     Fluent in Spanish    Was one of 6 kids, 2 siblings have died. Megon is in the middle.     Her parents are [place].     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    ascorbic acid (VITAMIN C) 500 MG tablet Take 500 mg by mouth in the morning.    VITAMIN D PO Take 600 mg by mouth daily    polyethylene glycol (GOLYTELY) 236 g suspension Take 4,000 mLs by mouth See Admin Instructions Take as directed prior to your colonoscopy    Simethicone 80 MG TABS Take 4 tablets by mouth See Admin Instructions Take as directed prior to colonoscopy.    escitalopram (LEXAPRO) 10 MG tablet Take 1 tablet by mouth in the morning.    rosuvastatin (CRESTOR) 5 MG tablet Take 1 tablet by mouth in the morning.     No current facility-administered medications for this encounter.         Review of patient's family history indicates:  Problem: Heart Disease      Relation: Mother          Age of Onset: (Not Specified)          Comment: died at 27 from CVA?  Problem: Stroke      Relation: Mother          Age of Onset: (Not Specified)          Comment: 29  Problem: Cancer - Prostate      Relation: Father          Age of Onset: (Not Specified)          Comment: 65 from prostate cancer  Problem: Heart Disease      Relation: Father          Age of Onset: (Not Specified)          Comment:  angina  Problem: Cancer - Lung      Relation: Sister          Age of Onset: (Not Specified)          Comment: smoker 44, was a 9/[employer] first responder  Problem: Cancer - Other      Relation: Sister          Age of Onset: (Not Specified)          Comment: leukmeia  Problem: High Cholesterol      Relation: Sister          Age of Onset: (Not Specified)  Problem: Diabetes      Relation: Brother          Age of Onset: (Not Specified)  Problem: Heart      Relation: Brother          Age of Onset: (Not Specified)          Comment: cariomypathy died at 66  Problem: Diabetes      Relation: Brother          Age of Onset: (Not Specified)  Problem: High Cholesterol      Relation: Brother          Age of Onset: (Not Specified)  Problem: Cancer - Prostate      Relation: Brother          Age of Onset: (Not  Specified)  Problem: OTHER      Relation: Maternal Grandfather          Age of Onset: (Not Specified)          Comment: died at 13, in wheelchair from horse accident  Problem: OTHER      Relation: Paternal Grandmother          Age of Onset: (Not Specified)          Comment: 89-90  Problem: OTHER      Relation: Paternal Grandfather          Age of Onset: (Not Specified)          Comment: unknown   Problem: Cancer - Colon      Relation: FamHxNeg          Age of Onset: (Not Specified)  Problem: Cancer - Cervical      Relation: FamHxNeg          Age of Onset: (Not Specified)  Problem: Cancer - Breast      Relation: FamHxNeg          Age of Onset: (Not Specified)  Problem: Cancer - Ovarian      Relation: FamHxNeg          Age of Onset: (Not Specified)  Problem: Alcohol/Drug Abuse      Relation: FamHxNeg          Age of Onset: (Not Specified)    REVIEW OF SYSTEMS:   Cardiovascular:  No chest pain, LE edema  Pulmonary:  No SOB or cough  Neuro:  No focal weakness, seizures    Physical Exam:  Temp:  [98 F (36.7 C)] 98 F (36.7 C)  Pulse:  [62] 62  Resp:  [9] 9  BP: (138)/(78) 138/78  Vital Signs: BP 138/78   Pulse 62   Temp 98 F (36.7  C) (Temporal)   Resp 9   Ht 5\' 8"  (1.727 m)   Wt 77.6 kg (171 lb)   SpO2 97%   BMI 26.00 kg/m  Body mass index is 26 kg/m.  General: Awake, alert, NAD  Pulmonary: Normal WOB, no wheezing  Cardiovascular: RRR, no murmurs  Abdominal: soft, NT/ND  Neuro: Nonfocal, moving all extremities symmetrically    Labs:  Lab Results   Component Value Date    NA 141 03/06/2023    K 4.5 03/06/2023    CL 102 03/06/2023    CO2 29 03/06/2023    BUN 25 (H) 03/06/2023    CREAT 0.8 03/06/2023    GLUCOSER 77 03/06/2023    ALBUMIN 4.3 03/06/2023    TBILI 0.3 03/06/2023    DBILI 0.1 03/06/2023    IBIL 0.1 (L) 03/06/2023    AST 21 03/06/2023    ALT 21 03/06/2023    ALKPHOS 122 (H) 03/06/2023     Lab Results   Component Value Date    WBC 8.4 01/15/2023    HCT 43.7 01/15/2023    PLTA 294 01/15/2023    PT 11.0 09/20/2021    INR 1.0 09/20/2021    APTT 32.0 09/20/2021       ASA 2    Impression:  GERD    Medical Decision Making:  The patient will have a diagnostic EGD done. The patient was informed of the risk of bleeding, discomfort, perforation, a need for surgery, infection, drug reaction, cardiovascular and cerebrovascular compromise and the possibility of missing a lesion or problem.  She understood these risks and consented to the exam.  All questions were answered. The patient is in agreement with our plan. The patient will have MAC anesthesia support during their procedure.     Seth Bake, MD, 03/08/2023

## 2023-03-08 NOTE — PROVATION-GI (Signed)
[facility]  Patient Name: Denise Gutierrez  MRN: [id number]  CSN: [id number]  Date of Birth: November 07, 1955  Admit Type: Outpatient  Age: 67  Gender: Female  Note Status: Finalized  Patient Location: [facility]  Referring MD:          Elenore Paddy. Aileen Pilot DO, DO  Procedure Date:        03/08/2023 9:22:38 AM  Procedure:             Upper GI endoscopy  Endoscopist:           Edsel Petrin MD, MD  Indications for Procedure:       Esophageal reflux  Medications:           Monitored Anesthesia Care  Procedure:       Just prior to the procedure, an updated history and physical was done. I        obtained an informed consent from the patient reviewing the risk of the        procedure including (but not limited to) respiratory depression, perforation,        bleeding, discomfort, a possible need for surgery and unexpected reactions to        medications. The patient is aware that test has limitations and may not        detect significant lesions such as cancer or other potential diseases. The        patient was also informed that they might need a repeat upper endoscopy        earlier than standard guidelines if there are changes in their symptoms or        concerning findings noted. A time out was performed with the entire procedure        staff present. The scope was passed under direct vision. Throughout the        procedure, the patient's blood pressure, pulse, and oxygen saturations were        monitored continuously. The Endoscope was introduced through the mouth, and        advanced to the second part of duodenum. The upper GI endoscopy was        accomplished without difficulty. The patient tolerated the procedure well.  Findings:       The Z-line was irregular and was found 39 cm from the incisors. Biopsies were        taken with a cold forceps for histology.       Patchy mild inflammation characterized by erythema was found in the entire        examined stomach. Biopsies were taken with a cold forceps for  Helicobacter        pylori testing.       The examined duodenum was normal. Biopsies for histology were taken with a        cold forceps for evaluation of celiac disease.  Total Procedure Duration: 0 hours 5 minutes 48 seconds   Post Procedure Diagnosis:       - Z-line irregular, 39 cm from the incisors. Biopsied.       - Gastritis. Biopsied.       - Normal examined duodenum. Biopsied.  Complications:         No immediate complications.  Estimated Blood Loss:  Estimated blood loss was minimal.  Recommendation:       - Await pathology results.       - Use Protonix (pantoprazole) 40 mg PO daily for 3 months.       -  GERD prevention diet.       - Follow an antireflux regimen.       - Return to GI clinic in 3 months can schedule with MD or PA.  Seth Bake, MD  Casimiro Needle Charlton Amor MD, MD  03/08/2023 10:24:18 AM  This report has been signed electronically.  Number of Addenda: 0  Note Initiated On: 03/08/2023 9:22 AM

## 2023-03-09 NOTE — Telephone Encounter (Signed)
[facility], Denise Gutierrez is a 67 year old female has requested a refill of fluticasone nasal spray.      Last Office Visit: [id number] with Levonne Lapping  Last Physical Exam: [id number]      Other Med Adult:  Most Recent BP Reading(s)  03/08/23 : 113/71        Cholesterol (mg/dL)   Date Value   17/61/6073 300 (H)     LOW DENSITY LIPOPROTEIN DIRECT (mg/dL)   Date Value   71/08/2692 230 (H)     HIGH DENSITY LIPOPROTEIN (mg/dL)   Date Value   85/46/2703 67     TRIGLYCERIDES (mg/dL)   Date Value   50/11/3816 181 (H)         THYROID SCREEN TSH REFLEX FT4 (uIU/mL)   Date Value   07/03/2022 2.520         TSH (THYROID STIM HORMONE) (uIU/mL)   Date Value   09/20/2021 2.940       HEMOGLOBIN A1C (%)   Date Value   01/15/2023 5.7 (H)       No results found for: "POCA1C"      INR (no units)   Date Value   09/20/2021 1.0   07/05/2021 1.0   03/13/2021 1.1       SODIUM (mmol/L)   Date Value   03/06/2023 141       POTASSIUM (mmol/L)   Date Value   03/06/2023 4.5           CREATININE (mg/dL)   Date Value   29/93/7169 0.8       Documented patient preferred pharmacies:    [id number] - [place], [place] - [address]  Phone: [phone] Fax: [phone]

## 2023-03-12 LAB — SURGICAL PATH SPECIMEN GASTROINTESTINAL

## 2023-03-16 ENCOUNTER — Encounter (HOSPITAL_BASED_OUTPATIENT_CLINIC_OR_DEPARTMENT_OTHER): Payer: Self-pay | Admitting: Student in an Organized Health Care Education/Training Program

## 2023-03-16 DIAGNOSIS — R748 Abnormal levels of other serum enzymes: Secondary | ICD-10-CM

## 2023-03-16 DIAGNOSIS — E7849 Other hyperlipidemia: Secondary | ICD-10-CM

## 2023-03-16 NOTE — Progress Notes (Signed)
LDL increased off statin, CK improved, alk phos elevated  Denise Gutierrez remains positive

## 2023-04-02 ENCOUNTER — Other Ambulatory Visit (HOSPITAL_BASED_OUTPATIENT_CLINIC_OR_DEPARTMENT_OTHER): Payer: Self-pay | Admitting: Student in an Organized Health Care Education/Training Program

## 2023-04-08 ENCOUNTER — Encounter (HOSPITAL_BASED_OUTPATIENT_CLINIC_OR_DEPARTMENT_OTHER): Payer: Self-pay | Admitting: Critical Care Medicine

## 2023-04-08 NOTE — Progress Notes (Signed)
Sleep MD order review:    Date: 04/08/2023    Time: 8:26 PM    Primary care provider: Verlin Dike, DO      I have reviewed the sleep study order in detail.    The study is approved to be done.

## 2023-04-10 ENCOUNTER — Ambulatory Visit: Payer: Medicare HMO | Attending: Student in an Organized Health Care Education/Training Program

## 2023-04-10 ENCOUNTER — Other Ambulatory Visit: Payer: Self-pay

## 2023-04-10 DIAGNOSIS — R400 Somnolence: Secondary | ICD-10-CM | POA: Diagnosis present

## 2023-04-23 DIAGNOSIS — G4733 Obstructive sleep apnea (adult) (pediatric): Secondary | ICD-10-CM

## 2023-04-24 ENCOUNTER — Encounter (HOSPITAL_BASED_OUTPATIENT_CLINIC_OR_DEPARTMENT_OTHER): Payer: Self-pay | Admitting: Student in an Organized Health Care Education/Training Program

## 2023-04-24 ENCOUNTER — Other Ambulatory Visit (HOSPITAL_BASED_OUTPATIENT_CLINIC_OR_DEPARTMENT_OTHER): Payer: Self-pay | Admitting: Student in an Organized Health Care Education/Training Program

## 2023-04-24 DIAGNOSIS — G4733 Obstructive sleep apnea (adult) (pediatric): Secondary | ICD-10-CM

## 2023-04-26 ENCOUNTER — Encounter (HOSPITAL_BASED_OUTPATIENT_CLINIC_OR_DEPARTMENT_OTHER): Payer: Self-pay | Admitting: Family

## 2023-05-01 ENCOUNTER — Encounter (HOSPITAL_BASED_OUTPATIENT_CLINIC_OR_DEPARTMENT_OTHER): Payer: Self-pay

## 2023-05-01 NOTE — Telephone Encounter (Signed)
 Patient MyChart message received by [facility], no emergent symptoms noted in written request. Chart reviewed, no noted history concerning for presenting complaint.   Response previously sent instructing patient to call and speak with nurse determined to be appropriate upon review of record.

## 2023-05-19 ENCOUNTER — Other Ambulatory Visit (HOSPITAL_BASED_OUTPATIENT_CLINIC_OR_DEPARTMENT_OTHER): Payer: Self-pay | Admitting: Internal Medicine

## 2023-05-19 DIAGNOSIS — E7849 Other hyperlipidemia: Secondary | ICD-10-CM

## 2023-06-11 ENCOUNTER — Ambulatory Visit: Payer: Medicare HMO | Admitting: Student in an Organized Health Care Education/Training Program

## 2023-06-11 ENCOUNTER — Other Ambulatory Visit: Payer: Self-pay

## 2023-06-11 ENCOUNTER — Encounter (HOSPITAL_BASED_OUTPATIENT_CLINIC_OR_DEPARTMENT_OTHER): Payer: Self-pay | Admitting: Student in an Organized Health Care Education/Training Program

## 2023-06-11 ENCOUNTER — Ambulatory Visit: Admission: RE | Admit: 2023-06-11 | Discharge: 2023-06-11 | Disposition: A | Discharge status: Home / Self Care

## 2023-06-11 VITALS — BP 130/82 | HR 76 | Wt 175.0 lb

## 2023-06-11 DIAGNOSIS — D229 Melanocytic nevi, unspecified: Secondary | ICD-10-CM

## 2023-06-11 DIAGNOSIS — N644 Mastodynia: Secondary | ICD-10-CM

## 2023-06-11 DIAGNOSIS — R030 Elevated blood-pressure reading, without diagnosis of hypertension: Secondary | ICD-10-CM

## 2023-06-11 DIAGNOSIS — M4726 Other spondylosis with radiculopathy, lumbar region: Secondary | ICD-10-CM | POA: Insufficient documentation

## 2023-06-11 DIAGNOSIS — G4733 Obstructive sleep apnea (adult) (pediatric): Secondary | ICD-10-CM

## 2023-06-11 DIAGNOSIS — G8929 Other chronic pain: Secondary | ICD-10-CM

## 2023-06-11 DIAGNOSIS — K2270 Barrett's esophagus without dysplasia: Secondary | ICD-10-CM | POA: Diagnosis present

## 2023-06-11 DIAGNOSIS — T466X5A Adverse effect of antihyperlipidemic and antiarteriosclerotic drugs, initial encounter: Secondary | ICD-10-CM | POA: Diagnosis present

## 2023-06-11 DIAGNOSIS — R300 Dysuria: Secondary | ICD-10-CM

## 2023-06-11 DIAGNOSIS — E7849 Other hyperlipidemia: Secondary | ICD-10-CM

## 2023-06-11 DIAGNOSIS — M5441 Lumbago with sciatica, right side: Secondary | ICD-10-CM | POA: Diagnosis present

## 2023-06-11 DIAGNOSIS — G720 Drug-induced myopathy: Secondary | ICD-10-CM

## 2023-06-11 LAB — URINALYSIS RFLX TO URINE CULT
BILIRUBIN, URINE: NEGATIVE
GLUCOSE, URINE: NEGATIVE mg/dL
KETONE, URINE: NEGATIVE mg/dL
LEUKOCYTES, URINE: NEGATIVE
NITRITE, URINE: NEGATIVE
OCCULT BLOOD, URINE: NEGATIVE
PH, URINE: 5 (ref 5.0–8.0)
PROTEIN, URINE: NEGATIVE mg/dL
SPECIFIC GRAVITY, URINE: 1.015 (ref 1.003–1.035)
UROBILINOGEN, URINE: 0.2 U/dL (ref 0.2–1.0)

## 2023-06-11 LAB — HEMOGLOBIN A1C
ESTIMATED AVERAGE GLUCOSE: 117 mg/dL (ref 74–160)
HEMOGLOBIN A1C: 5.7 % — ABNORMAL HIGH (ref 4.0–5.6)

## 2023-06-11 LAB — LIPID PANEL
Cholesterol: 272 mg/dL — ABNORMAL HIGH (ref 0–239)
HIGH DENSITY LIPOPROTEIN: 58 mg/dL (ref 40–60)
LOW DENSITY LIPOPROTEIN DIRECT: 185 mg/dL (ref 0–189)
TRIGLYCERIDES: 301 mg/dL — ABNORMAL HIGH (ref 0–150)

## 2023-06-11 MED ORDER — NYSTATIN 100000 UNIT/GM EX CREA
TOPICAL_CREAM | CUTANEOUS | 0 refills | Status: AC | PRN
Start: 2023-06-11 — End: 2023-06-25

## 2023-06-11 MED ORDER — PANTOPRAZOLE SODIUM 40 MG PO TBEC
40.0000 mg | DELAYED_RELEASE_TABLET | Freq: Every day | ORAL | 1 refills | Status: AC
Start: 2023-06-11 — End: 2023-12-08

## 2023-06-11 NOTE — Progress Notes (Unsigned)
 PRIMARY CARE NOTE - FOLLOW-UP VISIT     SUBJECTIVE  Denise Gutierrez is a 68 year old English speaking female here for:    Chief concern(s): Follow-up    Chart review:  Last pcp visit December   Dx statin myopathy with elevated CK, myalgias improved after stopping statin, LDL increased  Heartburn and halitosis, EGD with gastritis, no Hypylori, path revealed glandular mucosa with intestinal metaplasia w/out dysplasia   OSA sleep study ordered --> moderate sleep apnea    Mildly elevated ribonucleoprotein, negative CCP and RF     Today patient reports:    Right upper breast pain  Also some pain/ itching on lateral aspect of right breast   Has not noticed any breast lumps   No family hx breast cancer     Right lower back pain/ hip pain   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  Two weeks of burning pain/ stabbing laterally on right lower back   Also had numbness and tingling down right leg a/w slight right leg weakness  Felt some numbness on right upper buttock  She has been more active, did gain a few pounds     Myalgias resolved since stopping statin, CK normalized  She has been more active, eating better, watching her diet      Mood is good   Not taking escitalopram   Interested in seeing a therapist     Did not start PPI for gastritis noted on EGD     Going on another cruise w/ her brother in May   [place]   Brother is being worked up for hematuria and pt is concerned about him, they are close        ROS - Pertinent symptoms as noted above, other ROS negative      Active Medical Issues:  Patient Active Problem List:     Elevated BP without diagnosis of hypertension     Snoring     Toenail deformity     Insomnia     Major depressive disorder, recurrent, mild (HCC)     GAD (generalized anxiety disorder)     Other hyperlipidemia     Right knee pain     Stiffness of hand joint     Neck pain     Left knee pain     Left hip pain     Left shoulder pain     Elevated hemoglobin A1c     Allergic rhinitis     OSA (obstructive sleep apnea)      Barrett's esophagus without dysplasia    Past medical, surgical, and social history reviewed. Allergies reviewed.     Medications reviewed:  Current Outpatient Medications   Medication Instructions    ascorbic acid (VITAMIN C) 500 mg, DAILY    fluticasone (FLONASE) 50 MCG/ACT nasal spray SPRAY 1 SPRAY INTO EACH NOSTRIL IN THE MORNING    Multiple Vitamins-Minerals (THERA-M) TABS 1 tablet, DAILY    nystatin (MYCOSTATIN) cream Topical, PRN    pantoprazole (PROTONIX) 40 mg, Oral, DAILY    VITAMIN D PO 600 mg, DAILY       OBJECTIVE  BP 130/82   Pulse 76   Wt 79.4 kg (175 lb)   SpO2 97%   BMI 26.61 kg/m     Physical Exam  Gen: well appearing, pleasant, nad  Mmm, no scleral icterus  Breathing comfortably  Skin wwp  Gait normal   Appropriate mood and affect  Back- no midline tenderness, no paraspinal muscle tenderness, patellar  reflexes 2+/4 bilaterally, negative straight leg raise test bilaterally, normal leg strength and sensation   Right breast without lumps or palpable masses, there is significant tenderness of right breast around 12:00 4 cm above nipple  Several open comedones on chest   There is a pigmented lesion about 2 mm just above right areola     Prior to the examination of one or more sensitive areas of the body (genital, rectum, or breast), this patient was informed and provided verbal consent.   Declined chaperone     I personally reviewed relevant labs, imaging, and studies in Epic[facility].     ASSESSMENT AND PLAN  Denise Gutierrez is a 68 year old female who presents with:    1. Other hyperlipidemia  2. Statin myopathy  - LIPID PANEL  - HEMOGLOBIN A1C  Myalgias resolved and ck normalized after stopping statin  Advised pt that she may need alternate therapy for HLD given last LDL was >200  She is working on diet and exercise  Check LDL again today to determine if further tx should be started.   - LIPID PANEL    3. OSA (obstructive sleep apnea)  Fu with sleep specialist for discussion re tx options     4.  Barrett's esophagus without dysplasia  START PPI  Should have surveillance egd in 3 yrs HM updated   - pantoprazole (PROTONIX) 40 MG tablet; Take 1 tablet by mouth daily  Dispense: 90 tablet; Refill: 1    5. Chronic right-sided low back pain with right-sided sciatica  Normal exam today, based on report it sounds like pt had episode of sciatica recently  Discussed PT and home exercises and close monitoring for recurrence. No signs of cord compression.   Given age will check XR lower back as this has not been done recently   - XR LUMBAR SPINE 2 OR 3 VIEWS; Future    6. Breast pain, right  No lumps however tenderness on palpation that is marked, should have mammogram and ultrasound to further characterize  - [place] DIAGNOSTIC MAMMO UNI RIGHT DIGITAL WITH DBT & CAD; Future  - [place] US BREAST LIMITED W/ OR W/O AXILLA RIGHT; Future    7. Elevated BP without diagnosis of hypertension  Within goal range medication pt's preference is to avoid medication if possible, continue exercise    Continue exercise and ambulatory bp monitoring     8. Dysuria  Noted at end of visit check ua for cystitis   - URINALYSIS RFLX TO URINE CULT    9. Skin mole  Should see dermatology for closer evaluation of lesion above areola   - REFERRAL TO DERMATOLOGY (INT)      Follow up in approximately 3 mos. ER and return precautions reviewed.     I spent a total of 35 minutes on this visit on the date of service (total time includes all activities performed on the date of service)  ____________    Verlin Dike, DO    *If medications were prescribed today, we discussed Denise Gutierrez's new and current medications, including risks, benefits, and potential side effects. The patient expressed understanding and no barriers to adherence were identified.  Any tests ordered today were discussed with the patient.  The patient expressed understanding with plan.    Time spent on the day of the visit on some or all of the following activities:   Preparing to see the  patient (eg, review of tests)   Obtaining and/or reviewing separately obtained history  Performing a medically appropriate examination and/or evaluation   Counseling and educating the patient/family/caregiver   Ordering medications, tests, or procedures   Referring and communicating with other health care professionals    Documenting clinical information in the electronic or other health record   Independently interpreting results and  communicating results to the patient/family/caregiver   Care coordination

## 2023-06-13 ENCOUNTER — Encounter (HOSPITAL_BASED_OUTPATIENT_CLINIC_OR_DEPARTMENT_OTHER): Payer: Self-pay | Admitting: Student in an Organized Health Care Education/Training Program

## 2023-06-13 NOTE — Progress Notes (Signed)
 Ldl improved 230 to 185, unable to take statin can consider Zetia  A1c stable  Ua normal

## 2023-06-16 ENCOUNTER — Encounter (HOSPITAL_BASED_OUTPATIENT_CLINIC_OR_DEPARTMENT_OTHER): Payer: Self-pay | Admitting: Student in an Organized Health Care Education/Training Program

## 2023-06-28 ENCOUNTER — Encounter (HOSPITAL_BASED_OUTPATIENT_CLINIC_OR_DEPARTMENT_OTHER): Payer: Self-pay

## 2023-07-12 ENCOUNTER — Ambulatory Visit (HOSPITAL_BASED_OUTPATIENT_CLINIC_OR_DEPARTMENT_OTHER)
Admission: RE | Admit: 2023-07-12 | Discharge: 2023-07-12 | Disposition: A | Discharge status: Home / Self Care | Source: Ambulatory Visit

## 2023-07-12 ENCOUNTER — Ambulatory Visit
Admission: RE | Admit: 2023-07-12 | Discharge: 2023-07-12 | Disposition: A | Discharge status: Home / Self Care | Source: Ambulatory Visit | Attending: Diagnostic Radiology | Admitting: Diagnostic Radiology

## 2023-07-12 ENCOUNTER — Encounter (HOSPITAL_BASED_OUTPATIENT_CLINIC_OR_DEPARTMENT_OTHER): Payer: Self-pay

## 2023-07-12 ENCOUNTER — Other Ambulatory Visit: Payer: Self-pay

## 2023-07-12 DIAGNOSIS — N644 Mastodynia: Secondary | ICD-10-CM

## 2023-07-22 ENCOUNTER — Ambulatory Visit: Payer: Medicare HMO | Attending: Critical Care Medicine | Admitting: Critical Care Medicine

## 2023-07-22 ENCOUNTER — Encounter (HOSPITAL_BASED_OUTPATIENT_CLINIC_OR_DEPARTMENT_OTHER): Payer: Self-pay | Admitting: Critical Care Medicine

## 2023-07-22 DIAGNOSIS — G4733 Obstructive sleep apnea (adult) (pediatric): Secondary | ICD-10-CM | POA: Insufficient documentation

## 2023-07-22 NOTE — Progress Notes (Signed)
 Reason for Visit:       Sleep Apnea (Recent diagnosis of moderate sleep apnea )        HPI: The patient is a 68 year old woman who is being seen for new patient visit in the sleep medicine clinic.  She was referred for sleep study based on a history of not sleeping well, waking up tired and snoring.  Her sleep study showed moderate obstructive sleep apnea.  She is here today to review the results and discuss treatment options.  She usually goes to bed around 11 PM.  It does not take her too long to sleep approximately about 10 minutes.  Most of the time she does not wake up although she might on occasion.  She gets out of bed by 8 AM.  She does have a history of loud snoring.  She is not aware of waking up short of breath.  She typically sleeps on her side in her stomach.  She does have some morning headaches.  She does wake up with a dry mouth.  She has a history of some nasal allergies although these are not a particular major issue when she is on Flonase .  She does not take any other over-the-counter allergy medications.  She denies any other issues with her nose.  She does not have any reflux it is waking her up.  She does not have any leg movements that are waking her up.    During the day she feels tired but she does not typically nap.  She denies any difficulty with her driving.  She does have some episodes of more difficulty with her sleep.  She attributes this to stress.  She will take 5 mg of zolpidem  but only does this a few times a month.  Otherwise she does not take any sleep aids.  She has tried melatonin in the past but this did not help much.  She drinks 1 cup of coffee in the morning.  She might have some occasional tea.  She drinks maybe a glass of wine or beer at about 5 to 6 PM 2 or 3 times a week.  She does not smoke.  She reports her weight is generally been up about 10 pounds.  She denies any other dental issues such as grinding.  She has not started any treatment for the sleep apnea  yet.      Additional  sleep History details :            Parasomnia/movements at night (Sleep walking, talking, terrors, violence) no  Narcolepsy symptoms : (Cataplexy, sleep paralysis, sleep hallucinations) no    Circadian rhythm/sleep phase/shift work issues: np  Daytime naps: As above  Drowsy work: Retired  Location manager driving: As above    Epworth Sleepiness Scale 4     Review of System:     Review of Systems  Weight up about 10 pounds/HEENT denies any grinding or clenching but does wake up with some sore throat chest denies waking up with any shortness of breath coughing or gasping cardiac does not wake up with any palpitations, abdomen does have a history of some GERD but this is not waking her up extremities no pain or leg movements waking up      Family history:    Review of patient's family history indicates:  Problem: Heart Disease      Relation: Mother          Age of Onset: (Not Specified)  Comment: died at 13 from CVA?  Problem: Stroke      Relation: Mother          Age of Onset: (Not Specified)          Comment: 46  Problem: Cancer - Prostate      Relation: Father          Age of Onset: 78          Comment: 57 from prostate cancer  Problem: Heart Disease      Relation: Father          Age of Onset: (Not Specified)          Comment: angina  Problem: Cancer - Lung      Relation: Sister          Age of Onset: (Not Specified)          Comment: smoker 44, was a 9/11 first responder  Problem: Cancer - Other      Relation: Sister          Age of Onset: (Not Specified)          Comment: leukmeia  Problem: High Cholesterol      Relation: Sister          Age of Onset: (Not Specified)  Problem: Diabetes      Relation: Brother          Age of Onset: (Not Specified)  Problem: Heart      Relation: Brother          Age of Onset: (Not Specified)          Comment: cariomypathy died at 27  Problem: Diabetes      Relation: Brother          Age of Onset: (Not Specified)  Problem: High Cholesterol      Relation:  Brother          Age of Onset: (Not Specified)  Problem: Cancer - Prostate      Relation: Brother          Age of Onset: (Not Specified)  Problem: Cancer - Colon      Relation: FamHxNeg          Age of Onset: (Not Specified)  Problem: Cancer - Cervical      Relation: FamHxNeg          Age of Onset: (Not Specified)  Problem: Cancer - Breast      Relation: FamHxNeg          Age of Onset: (Not Specified)  Problem: Cancer - Ovarian      Relation: FamHxNeg          Age of Onset: (Not Specified)  Problem: Alcohol/Drug Abuse      Relation: FamHxNeg          Age of Onset: (Not Specified)          Social history:     Social History     Socioeconomic History    Marital status: Single     Spouse name: Not on file    Number of children: Not on file    Years of education: Not on file    Highest education level: Not on file   Occupational History    Not on file   Tobacco Use    Smoking status: Never    Smokeless tobacco: Never   Substance and Sexual Activity    Alcohol use: Yes     Comment: 1 drink 3-4 days  a week    Drug use: Yes     Comment: once every 2-3 weeks, edible and vaping    Sexual activity: Not on file   Other Topics Concern    Not on file   Social History Narrative    July 2024    Had her son age 40    Values her independence, has lived by herself for a long time     Retired since 2020.     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     Fluent in Spanish    Was one of 6 kids, 2 siblings have died. Rosezetta is in the middle.     Her parents are [place].     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 [employer]   Social Drivers of Catering manager Strain: Not on file  Food Insecurity: Not on file  Transportation Needs: Not on file  Physical Activity: Not on file  Stress: Not on file  Social Connections: Not on file  Intimate Partner Violence: Not on file  Housing Stability: Not on file      Home situation: Lives with family    Occupation and work exposures: Retired from [employer] work    Social History    Tobacco Use      Smoking status: Never      Smokeless tobacco: Never        Alcohol use   Yes      Comment:   1 drink 3-4 days a week              Drug use:         Comment:   once every 2-3 weeks, edible and vaping        Exercise habits: Walking    PMH:   Past Medical History:  No date: Depression  No date: Elevated cholesterol  No date: HTN (hypertension)  Moderate obstructive sleep apnea   Allergies-   Review of Patient's Allergies indicates:   Contrast dye [iopam*    Hives    Comment:MRI  Current Outpatient Medications   Medication Sig    pantoprazole  (PROTONIX ) 40 MG tablet Take 1 tablet by mouth daily    fluticasone  (FLONASE ) 50 MCG/ACT nasal spray SPRAY 1 SPRAY INTO EACH NOSTRIL IN THE MORNING    Multiple Vitamins-Minerals (THERA-M) TABS Take 1 tablet by mouth in the morning.    ascorbic acid (VITAMIN C) 500 MG tablet Take 500 mg by mouth in the morning.    VITAMIN D  PO Take 600 mg by mouth daily     No current facility-administered medications for this visit.       Physical exam  There were no vitals taken for this visit.  68 year old female    Voice: Normal   Face: No lesions     HEENT: No abnormalities noted on limited view      Breathing unlabored  Neurologic: Alert & oriented x 3,     Psychiatry: Affect normal      Sleep data:   Home sleep study AHI 27, oxygen desaturation 79%, 35 minutes less than 89%.  Supine AHI was 33 versus 0 on the side however only about 17% of the night was spent in the nonsupine position            Assessment:        OSA (obstructive sleep apnea)  (primary encounter diagnosis)  68 year old patient with history of day time fatigue, GERD, loud snoring and new diagnosis of moderate OSA.      During the visit we discussed the sleep study results in detail.  We reviewed the pathophysiology of sleep apnea, including the risks of moderate sleep apnea such and in more severe OSA,  cardiac disease.   We also reviewed the risks of untreated OSA in regards to accidents.  She denies any issues or concerns with driving and has a normal ESS.     The treatment options for OSA were reviewed including CPAP, dental applianced and surgery.  The advantages of CPAP were reviewed.  The importance of treatment compliance ( at least 4 hours per night with CPAP or OAT) were also discussed.  We also talked about inspire and that this could be considered but usually after failure of a CPAP trial.      Factors that will worsen OSA including weight gain, alcohol use close to bed time, in many patients back sleep, and also sleep deprivation were reviewed.  The patient was encouraged to work on weight loss, or maintain a healthy weight, avoid alcohol close to bed time ( at least 2-3 hours) and only drink in moderation,  and also avoiding back sleep.   Her sleep study showed mostly OSA on the back, but there was also less than 20% of the night non supine.    We reviewed good sleep hygiene, and she reports occasionally taking a zolpidem  for stress related insomnia, but this is rare.     At this time she is planning to travel for the next month and does not want to start CPAP.  She also would like to consult with family and friends who use PAP therapy.  She will let me know if she decides to move ahead after discussing after she returns from her trip.  In the mean time she will continue to sleep off her back and I also suggested she could sleep propped up.        Plan:    As above       I spent a total of 35  minutes on this visit on  the date of service (total time includes all activities performed on the date of service)      Gussie Legato, MD  07/22/2023

## 2023-07-23 ENCOUNTER — Emergency Department (HOSPITAL_BASED_OUTPATIENT_CLINIC_OR_DEPARTMENT_OTHER)

## 2023-07-23 ENCOUNTER — Emergency Department
Admission: EM | Admit: 2023-07-23 | Discharge: 2023-07-23 | Disposition: A | Discharge status: Home / Self Care | Attending: Student in an Organized Health Care Education/Training Program | Admitting: Student in an Organized Health Care Education/Training Program

## 2023-07-23 ENCOUNTER — Other Ambulatory Visit: Payer: Self-pay

## 2023-07-23 DIAGNOSIS — R519 Headache, unspecified: Secondary | ICD-10-CM | POA: Diagnosis not present

## 2023-07-23 DIAGNOSIS — R110 Nausea: Secondary | ICD-10-CM | POA: Insufficient documentation

## 2023-07-23 DIAGNOSIS — R42 Dizziness and giddiness: Secondary | ICD-10-CM | POA: Diagnosis present

## 2023-07-23 LAB — COMPREHENSIVE METABOLIC PANEL
ALANINE AMINOTRANSFERASE: 21 U/L (ref 12–45)
ALBUMIN: 4.2 g/dL (ref 3.4–5.2)
ALKALINE PHOSPHATASE: 108 U/L (ref 45–117)
ANION GAP: 13 mmol/L (ref 10–22)
ASPARTATE AMINOTRANSFERASE: 20 U/L (ref 8–34)
BILIRUBIN TOTAL: 0.4 mg/dL (ref 0.2–1.0)
BUN (UREA NITROGEN): 18 mg/dL (ref 7–18)
CALCIUM: 10 mg/dL (ref 8.5–10.5)
CARBON DIOXIDE: 22 mmol/L (ref 21–32)
CHLORIDE: 104 mmol/L (ref 98–107)
CREATININE: 0.7 mg/dL (ref 0.4–1.2)
ESTIMATED GLOMERULAR FILT RATE: >60 mL/min (ref >60)
Glucose Random: 111 mg/dL (ref 74–160)
POTASSIUM: 4.4 mmol/L (ref 3.5–5.1)
SODIUM: 140 mmol/L (ref 136–145)
TOTAL PROTEIN: 6.6 g/dL (ref 6.4–8.2)

## 2023-07-23 LAB — CBC, PLATELET & DIFFERENTIAL
ABSOLUTE BASO COUNT: 0.1 10*3/uL (ref 0.0–0.1)
ABSOLUTE EOSINOPHIL COUNT: 0.1 10*3/uL (ref 0.0–0.8)
ABSOLUTE IMM GRAN COUNT: 0.04 10*3/uL (ref 0.00–0.10)
ABSOLUTE LYMPH COUNT: 1.1 10*3/uL (ref 0.6–5.9)
ABSOLUTE MONO COUNT: 0.5 10*3/uL (ref 0.2–1.4)
ABSOLUTE NEUTROPHIL COUNT: 7.1 10*3/uL (ref 1.6–8.3)
ABSOLUTE NRBC COUNT: 0 10*3/uL (ref 0.0–0.0)
BASOPHIL %: 1 % (ref 0.0–1.2)
EOSINOPHIL %: 1.5 % (ref 0.0–7.0)
HEMATOCRIT: 44.9 % (ref 34.1–44.9)
HEMOGLOBIN: 14 g/dL (ref 11.2–15.7)
IMMATURE GRANULOCYTE %: 0.4 % (ref 0.0–1.0)
LYMPHOCYTE %: 12.4 % — ABNORMAL LOW (ref 15.0–54.0)
MEAN CORP HGB CONC: 31.2 g/dL (ref 31.0–37.0)
MEAN CORPUSCULAR HGB: 26.8 pg (ref 26.0–34.0)
MEAN CORPUSCULAR VOL: 86 fl (ref 80.0–100.0)
MEAN PLATELET VOLUME: 11.6 fL (ref 8.7–12.5)
MONOCYTE %: 5.7 % (ref 4.0–13.0)
NEUTROPHIL %: 79 % — ABNORMAL HIGH (ref 40.0–75.0)
NRBC %: 0 % (ref 0.0–0.0)
PLATELET COUNT: 284 10*3/uL (ref 150–400)
RBC DISTRIBUTION WIDTH STD DEV: 44.6 fL (ref 35.1–46.3)
RED BLOOD CELL COUNT: 5.22 M/uL — ABNORMAL HIGH (ref 3.90–5.20)
WHITE BLOOD CELL COUNT: 9.1 10*3/uL (ref 4.0–11.0)

## 2023-07-23 LAB — TROPONIN T HS BASELINE: TROPONIN T HS BASELINE: 6 ng/L (ref 0–10)

## 2023-07-23 MED ORDER — MECLIZINE HCL 25 MG PO TABS
25.0000 mg | ORAL_TABLET | Freq: Three times a day (TID) | ORAL | 0 refills | Status: AC | PRN
Start: 2023-07-23 — End: 2023-07-30

## 2023-07-23 MED ORDER — MECLIZINE HCL 25 MG PO TABS
25.0000 mg | ORAL_TABLET | Freq: Once | ORAL | Status: AC
Start: 2023-07-23 — End: 2023-07-23
  Administered 2023-07-23: 25 mg via ORAL
  Filled 2023-07-23: qty 1

## 2023-07-23 NOTE — ED Provider Notes (Signed)
 The patient was seen primarily by me. ED nursing record was reviewed. Select prior records as available electronically through the Epic record were reviewed.    History, physical exam and disposition were conducted with an official hospital English interpreter.    HPI:    Denise Gutierrez is a 68 year old female patient with h/o depression, anxiety, hyperlipidemia presents to the emergency department today with chief complaint of room spinning sensation with associated nausea that started yesterday afternoon.  Patient states it is worse with position changes, she noticed it when she went to lay down at night.  She has not vomited.  She does describe a headache which is chronic and unchanged.  Denies any vision changes, extremity weakness.  She does not take any medications alleviate her symptoms.  She does state that she was seen at urgent care earlier today and was advised to come to the emergency department for CT imaging.    ROS: Pertinent positives were reviewed as per the HPI above. All other systems were reviewed and are negative.  Stephens Eis  Language of care: English  MRN: [id number]  PCP: Burma Carrel, DO  Mode of arrival to ED: Walk-in.  Arrival time:     Chief complaint: Dizziness    Past Medical History/Problem list:  Past Medical History:  No date: Depression  No date: Elevated cholesterol  No date: HTN (hypertension)  Patient Active Problem List:     Elevated BP without diagnosis of hypertension     Snoring     Toenail deformity     Insomnia     Major depressive disorder, recurrent, mild (HCC)     GAD (generalized anxiety disorder)     Other hyperlipidemia     Right knee pain     Stiffness of hand joint     Neck pain     Left knee pain     Left hip pain     Left shoulder pain     Elevated hemoglobin A1c     Allergic rhinitis     OSA (obstructive sleep apnea)     Barrett's esophagus without dysplasia    Past Surgical History: Past Surgical History:  No date: APPENDECTOMY  No date: BREAST BIOPSY;  Right      Comment:  neg.  No date: BREAST CYST EXCISION  No date: BREAST MASS EXCISION  No date: BUNIONECTOMY  No date: KNEE CARTILAGE SURGERY      Comment:  left  No date: RPR 1ST INGUN HRNA PRETERM INFT RDC      Comment:  as child  No date: VAGINAL HYSTERECTOMY UTERUS 250 GM/<      Comment:  did not remove cervix - due to fibroids  Social History:   Social History     Socioeconomic History    Marital status: Single     Spouse name: Not on file    Number of children: Not on file    Years of education: Not on file    Highest education level: Not on file   Occupational History    Not on file   Tobacco Use    Smoking status: Never    Smokeless tobacco: Never   Substance and Sexual Activity    Alcohol use: Yes     Comment: 1 drink 3-4 days a week    Drug use: Yes     Comment: once every 2-3 weeks, edible and vaping    Sexual activity: Not on file   Other Topics Concern  Not on file   Social History Narrative    July 2024    Had her son age 42    Values her independence, has lived by herself for a long time     Retired since 2020.     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     Fluent in Spanish    Was one of 6 kids, 2 siblings have died. Sharone is in the middle.     Her parents are [place].     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 [employer]   Social Drivers of Catering manager Strain: Not on file  Food Insecurity: Not on file  Transportation Needs: Not on file  Physical Activity: Not on file  Stress:  Not on file  Social Connections: Not on file  Intimate Partner Violence: Not on file  Housing Stability: Not on file   Allergies: Review of Patient's Allergies indicates:   Contrast dye [iopam*    Hives    Comment:MRI  Immunizations:   Immunization History   Administered Date(s) Administered    Covid-19 Vaccine (Moderna - Full Dose) 02/22/2020, 08/14/2020    Covid-19 Vaccine (Moderna Bivalent 68yo>) 12/04/2020    Covid-19 Vaccine (Pfizer - Purple Cap) 06/27/2019, 07/19/2019    Influenza Vac Quad (Aiiv4) inact, Adjuv Pre Free 0.37ml 12/04/2021    Influenza Vaccine High Dose 0.4ml 65 And Older 01/09/2021    Influenza Virus Quad Presv Free Vacc 6 Mo and Older, IM 04/15/2016, 12/04/2018    Influenza Virus Tri Presv Free 3/> YRS IM 01/28/2015    Influenza, Unspecified Formulation 02/22/2017    Moderna >12yo Covid-19 01/05/2022, 01/09/2023    PCV13 06/02/2020    PNEUMOCOCCAL POLYSACCHARIDE VACCINE v23 07/13/2021    Tdap 01/05/2020    Zoster Vacc (HZV) Recomb Adjv, IM, 2 Dose, (SHINGRIX) 12/21/2019, 04/22/2020          Medications:  Prior to Admission Medications   Prescriptions Last Dose Informant Patient Reported? Taking?   Multiple Vitamins-Minerals (THERA-M) TABS   Yes No   Sig: Take 1 tablet by mouth in the morning.   VITAMIN D  PO   Yes No   Sig: Take 600 mg by mouth daily   ascorbic acid (VITAMIN C) 500 MG tablet   Yes No   Sig: Take 500 mg by mouth in the morning.   fluticasone  (FLONASE ) 50 MCG/ACT nasal spray   No No   Sig: SPRAY 1 SPRAY INTO EACH NOSTRIL IN THE MORNING   pantoprazole  (PROTONIX ) 40 MG tablet   No No   Sig: Take 1 tablet by mouth daily      Facility-Administered Medications: None     Physical  Exam:   Patient Vitals for the past 99 hrs:   BP Temp Pulse Resp SpO2   07/23/23 1135 155/83 98 F 61 18 98 %     Physical Exam  Vitals and nursing note reviewed.   Constitutional:       General: She is not in acute distress.     Appearance: Normal appearance. She is normal weight. She is not ill-appearing or  toxic-appearing.   Eyes:      Extraocular Movements: Extraocular movements intact.      Comments: Notable nystagmus on exam   Neck:      Vascular: No carotid bruit.   Cardiovascular:      Rate and Rhythm: Normal rate and regular rhythm.      Pulses: Normal pulses.      Heart sounds: Normal heart sounds. No murmur heard.     No friction rub. No gallop.   Pulmonary:      Effort: Pulmonary effort is normal. No respiratory distress.      Breath sounds: Normal breath sounds. No wheezing, rhonchi or rales.   Chest:      Chest wall: No tenderness.   Abdominal:      General: Abdomen is flat.      Tenderness: There is no abdominal tenderness. There is no right CVA tenderness or left CVA tenderness.   Musculoskeletal:      Cervical back: Normal range of motion and neck supple. No rigidity.   Lymphadenopathy:      Cervical: No cervical adenopathy.   Skin:     General: Skin is warm and dry.   Neurological:      General: No focal deficit present.      Mental Status: She is alert and oriented to person, place, and time. Mental status is at baseline.      Cranial Nerves: No cranial nerve deficit.      Motor: No weakness.      Gait: Gait normal.   Psychiatric:         Mood and Affect: Mood normal.         Behavior: Behavior normal.         Thought Content: Thought content normal.         Medications Given in the ED:  Medications   meclizine (ANTIVERT) tablet 25 mg (25 mg Oral Given 07/23/23 1232)    Radiology Results:  CT head without contrast  IMPRESSION:     No CT evidence of acute intracranial findings. No large territorial infarct.     Mild atrophy and chronic small vessel ischemic disease.     Chest x-ray  No acute cardiopulmonary finding   Lab Results (abnormal results only):  Labs Reviewed   CBC, PLATELET & DIFFERENTIAL - Abnormal; Notable for the following components:       Result Value    RED BLOOD CELL COUNT 5.22 (*)     NEUTROPHIL % 79.0 (*)     LYMPHOCYTE % 12.4 (*)     All other components within normal limits    COMPREHENSIVE METABOLIC PANEL   TROPONIN T HS BASELINE    Other Results/Old Record review (e.g. ECG):  N/A     ED Course and Medical Decision-making:      68 year old female patient with room spinning dizziness sensation    Vital signs stable, patient is alert and oriented no acute distress resting comfortably on stretcher. Patient is very pleasant, appears well, hemodynamically stable, able to ambulate into the emergency  department without any difficulties.  Physical exam was significant for notable nystagmus with extraocular movement.  NIH stroke scale 0, neurological exam without any deficits, coordination intact, cranial nerves II through XII, equal bilateral sensation and strength in the bilateral lower extremities.  Differentials broad but includes vertigo, ACS, intracranial lesion.  Will obtain comprehensive workup including CBC, CMP, troponin, CT head, EKG and chest x-ray.  Will medicate with meclizine and plan to reassess.  Case will be discussed with the ED attending physician Dr. Nellene Banana.    On reassessment, patient with moderate symptomatic relief with meclizine while in the emergency department.  Laboratory studies independently reviewed by myself are without evidence of significant organ dysfunction or severe metabolic derangement.  EKG was interpreted by myself please see ED course, high-sensitivity troponin was 6 at baseline, low clinical suspicion for ACS at this time.  CT scan of the head did not show any intracranial lesions.  Chest x-ray without any acute cardiopulmonary pathology.  At this point given that the patient had great relief with meclizine do feel her symptoms are most consistent with vertigo.  Will send her home prescription for meclizine as well as instructions on how to perform the Epley maneuver.  She was given strict return precautions back to the emergency department for any new or worsening signs or symptoms.  Otherwise close follow-up with her outpatient providers.  Patient  remained hemodynamically stable in the emergency department today, verbalized understands plan deny questions at this time.      ED Course as of 07/23/23 1634   Tue Jul 23, 2023   1251 Comprehensive Metabolic Panel  Within normal limit   1251 Troponin T HS Baseline  Cancel 1 and 3-hour   1251 CBC, Platelet & Differential(!)  Within normal limit   1251 CT Head WO Contrast  IMPRESSION:     No CT evidence of acute intracranial findings. No large territorial infarct.     Mild atrophy and chronic small vessel ischemic disease.     1434 XR Chest 2 views  IMPRESSION:     No acute cardiopulmonary findings on chest radiographs.       Patient/family educated on diagnosis(es); she states understanding and agrees with plan of care.  Reasons to return to the ED were reviewed in detail. She agrees with this plan and disposition.    Condition on Discharge: Improved and Stable      Diagnosis/Diagnoses:  Vertigo    Discharge Prescriptions:  Discharge Medication List as of 07/23/2023  2:47 PM    START taking these medications    meclizine (ANTIVERT) 25 MG TABS  Take 1 tablet by mouth every 8 (eight) hours as needed  for up to 7 days  Tamperproof, Disp-15 tablet, R-0        Cleavon Curls, PA-C  This Emergency Department patient encounter note was created using voice-recognition software and in real time during the ED visit.

## 2023-07-23 NOTE — ED Triage Note (Signed)
 Noticed room spinning dizziness with nausea since yesterday afternoon. Worse with position changes. Denies vomiting. +headache (but is chronic). Denies visual changes. Denies extremity weakness. Family hx of vertigo. Denies any recent head trauma. Ambulatory with steady gait.

## 2023-07-23 NOTE — Narrator Note (Signed)
Patient Disposition  Patient education for diagnosis, medications, activity, diet and follow-up.  Patient left ED 3:00 PM.  Patient rep received written instructions.    Interpreter to provide instructions: No    Patient belongings with patient: YES    Have all existing LDAs been addressed? N/A    Have all IV infusions been stopped? N/A    Destination: Discharged to home

## 2023-07-23 NOTE — Discharge Instructions (Signed)
 Your evaluated in the emergency department today regarding your dizziness.  As we discussed I do think your symptoms are related to vertigo.  It is reassuring that you had good relief with meclizine while in the emergency department today.    Your workup including blood work, EKG, CT scan of the head were reassuring did not show any abnormalities.    For any new or worsening signs or symptoms please come back to emergency department.

## 2023-07-25 ENCOUNTER — Ambulatory Visit (HOSPITAL_BASED_OUTPATIENT_CLINIC_OR_DEPARTMENT_OTHER): Admitting: Nurse Practitioner

## 2023-07-26 LAB — EKG

## 2023-08-01 ENCOUNTER — Ambulatory Visit: Admitting: Nurse Practitioner

## 2023-08-29 ENCOUNTER — Ambulatory Visit

## 2023-08-30 ENCOUNTER — Other Ambulatory Visit (HOSPITAL_BASED_OUTPATIENT_CLINIC_OR_DEPARTMENT_OTHER)

## 2023-08-30 DIAGNOSIS — Z7189 Other specified counseling: Secondary | ICD-10-CM | POA: Diagnosis not present

## 2023-09-21 ENCOUNTER — Other Ambulatory Visit (HOSPITAL_BASED_OUTPATIENT_CLINIC_OR_DEPARTMENT_OTHER): Payer: Self-pay | Admitting: Student in an Organized Health Care Education/Training Program

## 2023-09-21 NOTE — Telephone Encounter (Signed)
 PER Pharmacy, Denise Gutierrez is a 68 year old female has requested a refill of      -  flonase        Last Office Visit: 06/11/23 with pcp       There are no preventive care reminders to display for this patient.     Other Med Adult:  Most Recent BP Reading(s)  07/23/23 : 155/83        Cholesterol (mg/dL)   Date Value   96/81/7974 272 (H)     LOW DENSITY LIPOPROTEIN DIRECT (mg/dL)   Date Value   96/81/7974 185     HIGH DENSITY LIPOPROTEIN (mg/dL)   Date Value   96/81/7974 58     TRIGLYCERIDES (mg/dL)   Date Value   96/81/7974 301 (H)         THYROID  SCREEN TSH REFLEX FT4 (uIU/mL)   Date Value   07/03/2022 2.520         TSH (THYROID  STIM HORMONE) (uIU/mL)   Date Value   09/20/2021 2.940       HEMOGLOBIN A1C (%)   Date Value   06/11/2023 5.7 (H)       No results found for: POCA1C      INR (no units)   Date Value   09/20/2021 1.0   07/05/2021 1.0   03/13/2021 1.1       SODIUM (mmol/L)   Date Value   07/23/2023 140       POTASSIUM (mmol/L)   Date Value   07/23/2023 4.4           CREATININE (mg/dL)   Date Value   95/70/7974 0.7        Documented patient preferred pharmacies:    [id number] - [place], [place] - [address]  Phone: [phone] Fax: [phone]

## 2023-09-24 ENCOUNTER — Encounter (HOSPITAL_BASED_OUTPATIENT_CLINIC_OR_DEPARTMENT_OTHER): Payer: Self-pay | Admitting: Student in an Organized Health Care Education/Training Program

## 2023-09-24 ENCOUNTER — Other Ambulatory Visit: Payer: Self-pay

## 2023-09-24 ENCOUNTER — Ambulatory Visit
Attending: Student in an Organized Health Care Education/Training Program | Admitting: Student in an Organized Health Care Education/Training Program

## 2023-09-24 VITALS — BP 139/87 | HR 69

## 2023-09-24 DIAGNOSIS — F419 Anxiety disorder, unspecified: Secondary | ICD-10-CM | POA: Diagnosis present

## 2023-09-24 DIAGNOSIS — E7849 Other hyperlipidemia: Secondary | ICD-10-CM | POA: Diagnosis present

## 2023-09-24 DIAGNOSIS — F330 Major depressive disorder, recurrent, mild: Secondary | ICD-10-CM | POA: Diagnosis present

## 2023-09-24 DIAGNOSIS — Z809 Family history of malignant neoplasm, unspecified: Secondary | ICD-10-CM | POA: Diagnosis present

## 2023-09-24 MED ORDER — BUPROPION HCL ER (XL) 150 MG PO TB24
150.0000 mg | ORAL_TABLET | Freq: Every morning | ORAL | 2 refills | Status: DC
Start: 2023-09-24 — End: 2023-11-22

## 2023-09-24 NOTE — Progress Notes (Unsigned)
 PRIMARY CARE NOTE - FOLLOW-UP VISIT     SUBJECTIVE  Denise Gutierrez is a 68 year old English speaking female here for:    Chief concern(s): Follow-up    Chart review:  Dx OSA seen in sleep clinic consider pap therapy but wanted to discuss w/ family first   ER visit late April for dizziness - dx BPPV improved w/ meclizine      Today patient reports:  ***    She has been tired  Feeling fatigued not sleeping well  Difficulty falling asleep then feeling groggy in the am  Stays in bed longer usually never does this but lacking motivation     Sister who she is very close with fell down the stairs (does not see well)  Dislocated her elbow and fractured arm, had to have surgery   Dx thymoma - path with cancer so now having to meet wth onc and may need radiation   She has leukemia     Brother dealing with health issues     Trying to move more she knows she needs to take care of herself to help care for family   Self increased Lexapro  to 20 mg daily  Still sometimes feeling overhwelming sadness     Going to [place] on family trip later this month     Has been having some left shoulder pain   At rest and when using arm  No jaw or chest pain   No heartburn     Lexapro  x8 yrs   Depression and anxiety   Very effective at onset, efficacy waned, then felt better stopped  Not not working as well   No other ssri  Buspirone  not helpful   Trazodone  not effective       ROS - Pertinent symptoms as noted above, other ROS negative    Active Medical Issues:  Patient Active Problem List:     Elevated BP without diagnosis of hypertension     Snoring     Toenail deformity     Insomnia     Major depressive disorder, recurrent, mild (HCC)     GAD (generalized anxiety disorder)     Other hyperlipidemia     Right knee pain     Stiffness of hand joint     Neck pain     Left knee pain     Left hip pain     Left shoulder pain     Elevated hemoglobin A1c     Allergic rhinitis     OSA (obstructive sleep apnea)     Barrett's esophagus without  dysplasia    Past medical, surgical, and social history reviewed. Allergies reviewed.     Medications reviewed:  Current Outpatient Medications   Medication Instructions    ascorbic acid (VITAMIN C) 500 mg, DAILY    fluticasone  (FLONASE ) 50 MCG/ACT nasal spray SPRAY 1 SPRAY INTO EACH NOSTRIL IN THE MORNING    Multiple Vitamins-Minerals (THERA-M) TABS 1 tablet, DAILY    pantoprazole  (PROTONIX ) 40 mg, Oral, DAILY    VITAMIN D  PO 600 mg, DAILY       OBJECTIVE  BP 139/87   Pulse 69   SpO2 97%     Physical Exam  ***    I personally reviewed relevant labs, imaging, and studies in EpicCare.     ASSESSMENT AND PLAN  Denise Gutierrez is a 68 year old female who presents with:    Discuss cancer screening - genetics   Pt prefers not to know  for herself but may want to know for her family     ***fasting labs   ***OV     Follow up in approximately ***. ER and return precautions reviewed.     {Document time spent to support E&M coding:12984}  ____________    Corean Greenspan, DO    *If medications were prescribed today, we discussed Kairi Gipson's new and current medications, including risks, benefits, and potential side effects. The patient expressed understanding and no barriers to adherence were identified.  Any tests ordered today were discussed with the patient.  The patient expressed understanding with plan.    Time spent on the day of the visit on some or all of the following activities:   Preparing to see the patient (eg, review of tests)   Obtaining and/or reviewing separately obtained history   Performing a medically appropriate examination and/or evaluation   Counseling and educating the patient/family/caregiver   Ordering medications, tests, or procedures   Referring and communicating with other health care professionals    Documenting clinical information in the electronic or other health record   Independently interpreting results and  communicating results to the patient/family/caregiver   Care coordination

## 2023-09-24 NOTE — Patient Instructions (Signed)
 DECREASE the Lexapro  to 10 mg once per day for 3 days, then decrease to 5 mg once per day for 3 days and at the same time START the bupropion 150 mg once per day     After 3 days STOP the Lexapro  and continue the bupropion      7/2- Lexapro  10 mg daily  7/3- Lexapro  10 mg daily  7/4- Lexapro  10 mg daily  7/5- Lexapro  5 mg daily + bupropion 150 mg daily   7/6- Lexapro  5 mg daily + bupropion 150 mg daily   7/7- Lexapro  5 mg daily + bupropion 150 mg daily   7/8- Buropion 150 mg daily  7/9- Buropion 150 mg daily  7/10- Buropion 150 mg daily

## 2023-09-26 ENCOUNTER — Ambulatory Visit (HOSPITAL_BASED_OUTPATIENT_CLINIC_OR_DEPARTMENT_OTHER)

## 2023-10-07 ENCOUNTER — Encounter (HOSPITAL_BASED_OUTPATIENT_CLINIC_OR_DEPARTMENT_OTHER): Payer: Self-pay | Admitting: Student in an Organized Health Care Education/Training Program

## 2023-10-10 ENCOUNTER — Ambulatory Visit

## 2023-11-04 ENCOUNTER — Ambulatory Visit
Attending: Student in an Organized Health Care Education/Training Program | Admitting: Student in an Organized Health Care Education/Training Program

## 2023-11-04 ENCOUNTER — Ambulatory Visit: Payer: Medicare HMO | Admitting: Internal Medicine

## 2023-11-04 ENCOUNTER — Other Ambulatory Visit: Payer: Self-pay

## 2023-11-04 ENCOUNTER — Emergency Department (HOSPITAL_BASED_OUTPATIENT_CLINIC_OR_DEPARTMENT_OTHER)

## 2023-11-04 ENCOUNTER — Emergency Department: Admission: EM | Admit: 2023-11-04 | Discharge: 2023-11-04 | Disposition: A | Discharge status: Home / Self Care

## 2023-11-04 ENCOUNTER — Encounter (HOSPITAL_BASED_OUTPATIENT_CLINIC_OR_DEPARTMENT_OTHER): Payer: Self-pay | Admitting: Student in an Organized Health Care Education/Training Program

## 2023-11-04 VITALS — BP 126/85 | HR 69 | Temp 98.1°F | Ht 68.0 in | Wt 168.0 lb

## 2023-11-04 DIAGNOSIS — F419 Anxiety disorder, unspecified: Secondary | ICD-10-CM | POA: Diagnosis present

## 2023-11-04 DIAGNOSIS — M79605 Pain in left leg: Secondary | ICD-10-CM | POA: Insufficient documentation

## 2023-11-04 DIAGNOSIS — M1712 Unilateral primary osteoarthritis, left knee: Secondary | ICD-10-CM | POA: Diagnosis not present

## 2023-11-04 DIAGNOSIS — M7989 Other specified soft tissue disorders: Secondary | ICD-10-CM | POA: Diagnosis present

## 2023-11-04 DIAGNOSIS — E7849 Other hyperlipidemia: Secondary | ICD-10-CM | POA: Insufficient documentation

## 2023-11-04 DIAGNOSIS — F330 Major depressive disorder, recurrent, mild: Secondary | ICD-10-CM | POA: Diagnosis present

## 2023-11-04 DIAGNOSIS — M25562 Pain in left knee: Secondary | ICD-10-CM | POA: Insufficient documentation

## 2023-11-04 MED ORDER — IBUPROFEN 600 MG PO TABS
600.0000 mg | ORAL_TABLET | Freq: Four times a day (QID) | ORAL | 0 refills | Status: AC | PRN
Start: 2023-11-04 — End: 2023-11-09

## 2023-11-04 MED ORDER — ESCITALOPRAM OXALATE 10 MG PO TABS
10.0000 mg | ORAL_TABLET | Freq: Every day | ORAL | 2 refills | Status: DC
Start: 2023-11-04 — End: 2023-11-04

## 2023-11-04 NOTE — Progress Notes (Signed)
 PRIMARY CARE NOTE - FOLLOW-UP VISIT     SUBJECTIVE  Denise Gutierrez is a 68 year old English speaking female here for:    Chief concern(s): Follow-up    Chart review:  History of elevated d-dimer found as part of COVID research study without symptoms of DVT / PE  D-Dimer 09/20/2021 - 1924    Hyperlipidemia with intolerance of statin due to elevated CK to 700s in 2024  Referred to cardiology  Appointment with them later today    Last OV 09/24/2023 for mood  Taper and d/c escitalopram  and initiate bupropion  150 mg  Previous sleep study showed OSA, patient was unsure about CPAP and wanted to discuss with family    Today patient reports:  Anxious all the time - increased since taking buproprion  Always worried about something  Sleeping okay   Depressed mood has improved some  Feels more calm  Improved energy and motivation  Good appetite - a little too much  Multiple family / life stressors  No SI  No side effects    Feeling bloated last couple days  No constipation, no urinary difficulties or changes  No pain    Constant dull aching pain behind knee and above back of knee for last month  L leg swelling   No skin changes  No calf pain  Left foot feels numb, especially at night  Recent airplane travel to/from [place] (came back 2 days ago)  Walking helps relief the pain  Pains sometimes radiates to lower back / L buttocks  Tried ice, took meloxicam  with some relief   No shortness of breath or chest pain    Would like appointment for CPAP  In [place] was snoring a lot  Not feeling rested in morning 75% of the time    Alcohol - 1 drink every 2-3 days  No cigarettes, cannabis, or other substances    ROS - Pertinent symptoms as noted above, other ROS negative    Active Medical Issues:  Patient Active Problem List:     Elevated BP without diagnosis of hypertension     Snoring     Toenail deformity     Insomnia     Major depressive disorder, recurrent, mild (HCC)     GAD (generalized anxiety disorder)     Other hyperlipidemia      Right knee pain     Stiffness of hand joint     Neck pain     Left knee pain     Left hip pain     Left shoulder pain     Elevated hemoglobin A1c     Allergic rhinitis     OSA (obstructive sleep apnea)     Barrett's esophagus without dysplasia    Past medical, surgical, and social history reviewed. Allergies reviewed.     Medications reviewed:  Current Outpatient Medications   Medication Instructions    ascorbic acid (VITAMIN C) 500 mg, DAILY    buPROPion  (WELLBUTRIN  XL) 150 mg, Oral, EVERY MORNING    fluticasone  (FLONASE ) 50 MCG/ACT nasal spray SPRAY 1 SPRAY INTO EACH NOSTRIL IN THE MORNING    Multiple Vitamins-Minerals (THERA-M) TABS 1 tablet, DAILY    pantoprazole  (PROTONIX ) 40 mg, Oral, DAILY    VITAMIN D  PO 600 mg, DAILY       OBJECTIVE  BP 126/85   Pulse 69   Temp 98.1 F (36.7 C) (Oral)   Ht 5' 8 (1.727 m)   Wt 76.2 kg (168 lb)  SpO2 98%   BMI 25.54 kg/m     Physical Exam  General: appears well and in no acute distress  HEENT: moist mucus membranes  CV: regular rate and rhythm, no rubs/murmurs/gallops  Pulm: lungs clear to auscultation bilaterally  Extrem: bilateral below the knee 1+ pitting edema with L leg swelling > R, bulging straight vein along popliteal fossa up to superomedial edge of L knee, no erythema, no tenderness to palpation of calves or popliteal fossa bilaterally, no mass in L popliteal fossa, straight leg test negative, homan's sign negative    Labs-  Component      Latest Ref Rng 06/11/2023  3:57 PM   Cholesterol      0 - 239 mg/dL 727 (H)    TRIGLYCERIDES      0 - 150 mg/dL 698 (H)    HIGH DENSITY LIPOPROTEIN      40 - 60 mg/dL 58    LOW DENSITY LIPOPROTEIN DIRECT      0 - 189 mg/dL 814       The 89-bzjm ASCVD risk score (Arnett DK, et al., 2019) is: 11.3%    Values used to calculate the score:      Age: 66 years      Sex: Female      Is Non-Hispanic African American: No      Diabetic: No      Tobacco smoker: No      Systolic Blood Pressure: 149 mmHg      Is BP treated: No       HDL Cholesterol: 58 mg/dL      Total Cholesterol: 272 mg/dL    I personally reviewed relevant labs, imaging, and studies in EpicCare.     ASSESSMENT AND PLAN  Denise Gutierrez is a 68 year old female who presents with:    #L leg pain and swelling  Has a 1 month history of constant dull aching in and superior to L popliteal fossa with associated increased L leg swelling. Has associated L foot numbness and sometimes pain radiates to L buttocks. Improves with walking and gets worse with laying down / at night. Minimal relief with ice or meloxicam  therapy. Was recently on airplane. Has history of lumbar disc herniation and elevated d-dimer without associated clot found incidentally in 2023. No shortness of breath or chest pain. On exam, L lower extremity is more swollen than right, with bilateral 1+ pitting edema. There is a bulging straight vein along popliteal fossa up to superomedial edge of L knee. No erythema or tenderness to palpation. Straight leg test and Homan's sign negative. Presentation concerning for DVT and urgent evaluation is indicated.   - sent to ED for further evaluation    #MDD  #anxiety disorder  Ongoing history of MDD and anxiety. Previously was on escitalopram  for 7+ years but felt that it was no longer as effective. Tapered off and switched to bupropion  150 mg daily at last visit. Reports increased anxiety and improved mood without SI since starting medication. No side effects. Also has difficult life / family stressors occurring. Given that bupropion  has improved mood but caused increased anxiety and anxiety was managed better with escitalopram , will try augmentation therapy with both bupropion  and cialopram.   - start 5 mg daily escitalopram , increased after 5 days to 10 mg daily if no side effects  - continue bupropion  150 mg daily  - consider discussing role of therapy at future visit    #hyperlipidemia  History of hyperlipidemia with intolerance  to atorvastatin  and rosuvastatin  due to elevated CK.  Discontinued all statins at that time. Was referred to cardiology for further management, with initial appointment scheduled for today.  - will do apolipoprotein B and lipid panel at follow-up visit  - follow up with cardiology and appreciate recommends    #moderate OSA  Had sleep study 03/2023 which showed moderate OSA. Patient was unsure about CPAP at last visit. Today, patient would like to initiate CPAP due to not feeling rested 75% of the time when she wakes up and ongoing snoring. Needs assistance in setting this up.  - will assist in scheduling appointment with sleep pulmonology team for CPAP evaluation at follow-up visit       Follow up in approximately 2 weeks. ER and return precautions reviewed.     ____________    Lita Ireland (MS3)

## 2023-11-04 NOTE — Progress Notes (Signed)
 PRECEPTOR NOTE  On the day of the patient's visit, I personally saw and evaluated the patient. In addition, I reviewed findings with the MSII.  I confirm the key elements of history and physical exam as described in MSII note.  I agree with the assessment and plan as described below.  Please see MSII's note for further details.    Given asymmetric leg swelling, calf tenderness and recent airline travel pt was sent to ER for urgent evaluation to rule out DVT (unable to schedule timely ultrasound as outpatient). Case discussed w/ ER provider.     I spent a total of 42 minutes on this visit on the date of service (total time includes all activities performed on the date of service)

## 2023-11-04 NOTE — Discharge Instructions (Addendum)
 As discussed ultrasound negative for blood clots.  Knee x-ray does show some arthritic changes, which is likely related to your discomfort.  Continue to RICE rest ice compress elevate, and can follow-up with Ortho, referral placed.    Ibuprofen /Motrin /Advil : 600 mg every 6 hours, take with food  Tylenol/acetaminophen: 1000 mg every 4 hours, no more than 3 doses per day    If anytime develop new or worsening pain, calf swelling, or any other concerns, please return for reevaluation

## 2023-11-04 NOTE — ED Provider Notes (Signed)
 EMERGENCY DEPARTMENT PHYSICIAN ASSISTANT NOTE      The ED nursing record was reviewed.   This patient was seen with Emergency Department attending physician Dr. Gearld  Arrived to the ED by Self    CHIEF COMPLAINT    Patient presents with:  Leg Pain      HPI    Denise Gutierrez is a 68 year old female, denies chronic medical history, who comes to the emergency room for evaluation.  Patient states for the last month or so she has been having some soreness behind her left knee that has been getting slightly worse, especially at night.  Patient denies any calf swelling or pain.  Patient did recently travel to [place], [place] and [place], denies any known specific injury.  Patient denies any prior DVT.  Has had bilateral meniscus injury, did have surgery, believes it was on the left.    REVIEW OF SYSTEMS    General: no fever  MSK/skin: left leg pain  Psych: antidepressants     PAST MEDICAL HISTORY    Past Medical History:  No date: Depression  No date: Elevated cholesterol  No date: HTN (hypertension)    PROBLEM LIST  Patient Active Problem List:     Elevated BP without diagnosis of hypertension     Snoring     Toenail deformity     Insomnia     Major depressive disorder, recurrent, mild (HCC)     GAD (generalized anxiety disorder)     Other hyperlipidemia     Right knee pain     Stiffness of hand joint     Neck pain     Left knee pain     Left hip pain     Left shoulder pain     Elevated hemoglobin A1c     Allergic rhinitis     OSA (obstructive sleep apnea)     Barrett's esophagus without dysplasia      SURGICAL HISTORY    Past Surgical History:  No date: APPENDECTOMY  No date: BREAST BIOPSY; Right      Comment:  neg.  No date: BREAST CYST EXCISION  No date: BREAST MASS EXCISION  No date: BUNIONECTOMY  No date: KNEE CARTILAGE SURGERY      Comment:  left  2000: LASIK  No date: RPR 1ST INGUN HRNA PRETERM INFT RDC      Comment:  as child  No date: VAGINAL HYSTERECTOMY UTERUS 250 GM/<      Comment:  did not remove cervix -  due to fibroids    CURRENT MEDICATIONS    No current facility-administered medications for this encounter.    Current Outpatient Medications:     ibuprofen  (ADVIL ) 600 MG tablet, Take 1 tablet by mouth every 6 (six) hours as needed for Pain  for up to 5 days, Disp: 15 tablet, Rfl: 0    buPROPion  (WELLBUTRIN  XL) 150 MG 24 hr tablet, Take 1 tablet by mouth every morning, Disp: 30 tablet, Rfl: 2    fluticasone  (FLONASE ) 50 MCG/ACT nasal spray, SPRAY 1 SPRAY INTO EACH NOSTRIL IN THE MORNING, Disp: 48 mL, Rfl: 0    pantoprazole  (PROTONIX ) 40 MG tablet, Take 1 tablet by mouth daily, Disp: 90 tablet, Rfl: 1    Multiple Vitamins-Minerals (THERA-M) TABS, Take 1 tablet by mouth in the morning., Disp: , Rfl:     ascorbic acid (VITAMIN C) 500 MG tablet, Take 500 mg by mouth in the morning., Disp: , Rfl:     VITAMIN  D PO, Take 600 mg by mouth daily, Disp: , Rfl:     ALLERGIES    Review of Patient's Allergies indicates:   Contrast dye [iopam*    Hives    Comment:MRI    FAMILY HISTORY    Review of patient's family history indicates:  Problem: Heart Disease      Relation: Mother          Age of Onset: (Not Specified)          Comment: died at 42 from CVA?  Problem: Stroke      Relation: Mother          Age of Onset: (Not Specified)          Comment: 91  Problem: Cancer - Prostate      Relation: Father          Age of Onset: 60          Comment: 40 from prostate cancer  Problem: Heart Disease      Relation: Father          Age of Onset: (Not Specified)          Comment: angina  Problem: Cancer - Lung      Relation: Sister          Age of Onset: (Not Specified)          Comment: smoker 69, was a 9/11 first responder  Problem: Cancer - Other      Relation: Sister          Age of Onset: (Not Specified)          Comment: leukmeia (CML) on Gleevec, thymus cancer  Problem: High Cholesterol      Relation: Sister          Age of Onset: (Not Specified)  Problem: Retinal Detachment      Relation: Sister          Age of Onset: (Not  Specified)  Problem: Diabetes      Relation: Brother          Age of Onset: (Not Specified)  Problem: Heart      Relation: Brother          Age of Onset: (Not Specified)          Comment: cariomypathy died at 67  Problem: Diabetes      Relation: Brother          Age of Onset: (Not Specified)  Problem: High Cholesterol      Relation: Brother          Age of Onset: (Not Specified)  Problem: Cancer - Other      Relation: Brother          Age of Onset: (Not Specified)          Comment: bladder cancer  Problem: Retinal Detachment      Relation: Brother          Age of Onset: (Not Specified)  Problem: OTHER (oth)      Relation: Granddaughter          Age of Onset: (Not Specified)          Comment: Autism  Problem: Cancer - Colon      Relation: FamHxNeg          Age of Onset: (Not Specified)  Problem: Cancer - Cervical      Relation: FamHxNeg          Age of Onset: (Not Specified)  Problem: Cancer - Breast      Relation: FamHxNeg          Age of Onset: (Not Specified)  Problem: Cancer - Ovarian      Relation: FamHxNeg          Age of Onset: (Not Specified)  Problem: Alcohol/Drug Abuse      Relation: FamHxNeg          Age of Onset: (Not Specified)      SOCIAL HISTORY    Social History     Socioeconomic History    Marital status: Single     Spouse name: Not on file    Number of children: Not on file    Years of education: Not on file    Highest education level: Not on file   Occupational History    Not on file   Tobacco Use    Smoking status: Never    Smokeless tobacco: Never   Substance and Sexual Activity    Alcohol use: Yes     Comment: 1 drink 3-4 days a week    Drug use: Yes     Comment: once every 2-3 weeks, edible and vaping    Sexual activity: Not on file   Other Topics Concern    Not on file   Social History Narrative    July 2024    Had her son age 66    Values her independence, has lived by herself for a long time     Retired since 2020.     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     Fluent  in Spanish    Was one of 6 kids, 2 siblings have died. Rini is in the middle.     Her parents are [place].     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 Speaking full sentences.  Head: Normocephalic   Eyes: Vision grossly intact.  ENT: Mucous membranes moist.  Hearing grossly intact.    Neck: Normal range  of motion  Cardio.: palpable pedal pulse  Pulmonary: Nonlabored, speaking clearly  Musculoskeletal: Palpable pedal pulse, full dorsi plantarflexion.  No Achilles insertion tenderness.  No calf tenderness or edema.  Knee has mild posterior tenderness with no anterior tenderness, no laxity valgus varus stress.  Full flexion extension.  No tenderness with hip internal or external rotation  Neurological: CNII-XII grossly intact, No focal deficits noted.   Psychiatric: Appropriate for age and situation.  Skin: Warm, dry      RESULTS  No results found for this visit on 11/04/23 (from the past 24 hours).    RADIOLOGY  Knee x-ray: NAD, by my independent interpretation, arthritic changes  Ultrasound DVT rule out: Negative    MEDICATIONS ADMINISTERED ON THIS VISIT  Orders Placed This Encounter      ibuprofen  (ADVIL ) 600 MG tablet          Sig: Take 1 tablet by mouth every 6 (six) hours as needed for Pain  for up to 5 days          Dispense:  15 tablet          Refill:  0      ED COURSE & MEDICAL DECISION MAKING      I reviewed the patient's past medical history/problem list, past surgical history, medication list, social history and allergies.  External Records reviewed: PCP note from earlier today reviewed.          ED Decision Making & Course: Patient is a 68 year old female, history of depression well-controlled on medication, who comes to the emergency department complaining of 1 month history of pain in her left knee that now has been getting progressively worse extending down to her calf, and occasional sharp pain up towards her head.  Symptoms are worse at night.  Patient did have recent travel.  Physical examination shows afebrile nontoxic overall well-appearing female.  Posterior knee tenderness with no laxity, no overlying skin changes or evidence of infection.  Ultrasound reassuring with no DVT or large cyst.  X-ray consistent with arthritic changes, NAD.  Patient was instructed to treat symptomatically, RICE, and outpatient Ortho follow-up, referral placed.  Instructed to return for any new or worsening pain, acute swelling, signs of infection or any other concerns.  Patient seen and evaluated by ED attending, Dr. Gearld, who agrees with assessment and plan.  Patient remained hemodynamically stable during their stay in the emergency department.     Diagnosis: left knee pain, left leg pain  Condition: stable  Disposition: discharge    Follow Up: ortho    This Emergency Department patient encounter note was created using voice-recognition software and in real time during the ED visit, please excuse any  dictation errors.

## 2023-11-04 NOTE — Narrator Note (Signed)
 Patient Disposition  Patient education for diagnosis, medications, activity, diet and follow-up.  Patient left ED 2:04 PM.  Patient rep received written instructions.    Interpreter to provide instructions: No    Patient belongings with patient: YES    Have all existing LDAs been addressed? N/A    Have all IV infusions been stopped? N/A    Destination: Discharged to home  Discharge paperwork reviewed with patient, all questions and concerns addressed  Patient safely ambulating out of ED with belongings

## 2023-11-04 NOTE — ED Triage Note (Signed)
 Pt presents to ED with LLE pain. Pt reports one month of LLE pain in calf, recent travel to [place]. Pt reports numbness in LT foot especially at night.     Pt A/Ox4, appears in NAD, denies SOB, denies CP.

## 2023-11-06 ENCOUNTER — Encounter (HOSPITAL_BASED_OUTPATIENT_CLINIC_OR_DEPARTMENT_OTHER): Payer: Self-pay | Admitting: Critical Care Medicine

## 2023-11-07 ENCOUNTER — Other Ambulatory Visit: Payer: Self-pay

## 2023-11-07 ENCOUNTER — Ambulatory Visit: Admitting: Orthopaedic Surgery

## 2023-11-07 ENCOUNTER — Ambulatory Visit: Admitting: Dermatology

## 2023-11-07 DIAGNOSIS — M25562 Pain in left knee: Secondary | ICD-10-CM | POA: Diagnosis present

## 2023-11-07 MED ORDER — DICLOFENAC SODIUM 1 % EX GEL
4.0000 g | Freq: Four times a day (QID) | CUTANEOUS | 0 refills | Status: AC
Start: 2023-11-07 — End: 2023-12-07

## 2023-11-07 NOTE — Progress Notes (Signed)
(  M25.562) Acute pain of left knee  (primary encounter diagnosis)  Comment: Aug 11 XR reviewed - Mild DJD  Plan: REFERRAL TO PHYSICAL THERAPY (INT)    I, Alm Cater MD, performed the substantive portion of this split/shared visit.  Total time on date of encounter: 30 mins  I reviewed the medical record   I interviewed the patient  I examined the patient   I reviewed available data - images interpreted and reports reviewed  I discussed w PA  I performed the Medical Decision Making  I provided patient education  I documented in the EMR     Alm LELON Cater, MD, 11/07/2023

## 2023-11-07 NOTE — Progress Notes (Signed)
 Orthopedic Office Note    CC: Patient presents with:  Knee Pain: Left knee pain      ORTHOPEDIC PROBLEM LIST:  1.  Left knee pain  2.  Left knee osteoarthritis    HPI: Denise Gutierrez is a 68 year old female who is here today for evaluation of left knee pain.  Patient reports that she has been experiencing pain for the last month or so.  Denies any injury or trauma.  Her symptoms are localized around the posterior aspect of the knee.  Occasionally wake her from sleep.  Has avoided taking any Tylenol or ibuprofen  as she does not like taking oral anti-inflammatories.  Denies any swelling in the knee.  Reports that she had arthroscopic meniscus surgery over 10 years ago in the left knee. Denies any numbness or tingling distally.    IMAGING: x-ray of the left knee reveals mild degenerative changes. Imaging was reviewed and interpreted by myself and Dr. Quinten during this visit.     PHYSICAL EXAM:  GENERAL: Alert and oriented, in no acute distress  MOOD: Appropriate.   MUSCULOSKELETAL: Evaluation of the left knee reveals no gross bony deformities.  Overlying skin is warm, dry and intact. There is no erythema, edema, ecchymosis or effusion.  Mildly TTP over the medial joint line.  Ligaments are stable to varus and valgus stress. ROM 0-130 degrees.  LLE is neurovascularly intact distally.    ASSESSMENT/PLAN: 68 year old female here today with mild left knee osteoarthritis.  Discussed treatment options the patient including physical therapy, anti-inflammatory medications, topical gels, cortisone injections, and ultimately a total knee replacement down the road for definitive treatment.  Patient has friends and family who have had cortisone injections and had bad experiences if she does not want to do this today.  She will think about it over the next few weeks but would like to start with physical therapy.  I have provided her with a referral today and she will schedule this on her way out.  I also prescribed her some Voltaren  gel  to massage into the knee as she does not like taking oral anti-inflammatories.  Will see her back in 3 months for further evaluation and treatment.  If her symptoms do not improve with PT, we can consider a cortisone injection at that time.    We reviewed the likely diagnosis, the prognosis, and various treatment options in detail. Risks and benefits of treatment plan discussed. The patient's questions have been answered, and the patient understands agrees with treatment plan.     This note was prepared using voice recognition software. Please disregard any transcription errors.    Dr. Quinten saw and examined the patient and formulated the assessment and plan.    Tinnie Mew, PA-C, 11/07/2023

## 2023-11-08 ENCOUNTER — Encounter (HOSPITAL_BASED_OUTPATIENT_CLINIC_OR_DEPARTMENT_OTHER): Payer: Self-pay | Admitting: Critical Care Medicine

## 2023-11-08 ENCOUNTER — Other Ambulatory Visit (HOSPITAL_BASED_OUTPATIENT_CLINIC_OR_DEPARTMENT_OTHER): Payer: Self-pay | Admitting: Critical Care Medicine

## 2023-11-08 DIAGNOSIS — G4733 Obstructive sleep apnea (adult) (pediatric): Secondary | ICD-10-CM

## 2023-11-12 ENCOUNTER — Encounter (HOSPITAL_BASED_OUTPATIENT_CLINIC_OR_DEPARTMENT_OTHER): Payer: Self-pay | Admitting: Critical Care Medicine

## 2023-11-12 NOTE — Telephone Encounter (Signed)
 Pt read provider's message

## 2023-11-13 ENCOUNTER — Encounter (HOSPITAL_BASED_OUTPATIENT_CLINIC_OR_DEPARTMENT_OTHER): Payer: Self-pay

## 2023-11-13 ENCOUNTER — Ambulatory Visit: Admitting: Student in an Organized Health Care Education/Training Program

## 2023-11-13 NOTE — Progress Notes (Signed)
 Called pt to notify them that CPAP prescription has been sent and they will soon receive a call from [facility]

## 2023-11-22 ENCOUNTER — Ambulatory Visit
Attending: Student in an Organized Health Care Education/Training Program | Admitting: Student in an Organized Health Care Education/Training Program

## 2023-11-22 ENCOUNTER — Other Ambulatory Visit: Payer: Self-pay

## 2023-11-22 ENCOUNTER — Encounter (HOSPITAL_BASED_OUTPATIENT_CLINIC_OR_DEPARTMENT_OTHER): Payer: Self-pay | Admitting: Student in an Organized Health Care Education/Training Program

## 2023-11-22 VITALS — BP 129/82 | HR 75 | Temp 98.2°F | Wt 168.0 lb

## 2023-11-22 DIAGNOSIS — E7849 Other hyperlipidemia: Secondary | ICD-10-CM | POA: Diagnosis present

## 2023-11-22 DIAGNOSIS — F419 Anxiety disorder, unspecified: Secondary | ICD-10-CM | POA: Diagnosis present

## 2023-11-22 DIAGNOSIS — F411 Generalized anxiety disorder: Secondary | ICD-10-CM

## 2023-11-22 DIAGNOSIS — F330 Major depressive disorder, recurrent, mild: Secondary | ICD-10-CM | POA: Insufficient documentation

## 2023-11-22 DIAGNOSIS — R030 Elevated blood-pressure reading, without diagnosis of hypertension: Secondary | ICD-10-CM | POA: Insufficient documentation

## 2023-11-22 DIAGNOSIS — M25562 Pain in left knee: Secondary | ICD-10-CM | POA: Diagnosis present

## 2023-11-22 DIAGNOSIS — G4733 Obstructive sleep apnea (adult) (pediatric): Secondary | ICD-10-CM | POA: Insufficient documentation

## 2023-11-22 LAB — LIPID PANEL
Cholesterol: 267 mg/dL — ABNORMAL HIGH (ref 0–239)
HIGH DENSITY LIPOPROTEIN: 56 mg/dL (ref 40–60)
LOW DENSITY LIPOPROTEIN DIRECT: 202 mg/dL — ABNORMAL HIGH (ref 0–189)
TRIGLYCERIDES: 171 mg/dL — ABNORMAL HIGH (ref 0–150)

## 2023-11-22 MED ORDER — ESCITALOPRAM OXALATE 10 MG PO TABS
10.0000 mg | ORAL_TABLET | Freq: Every day | ORAL | 2 refills | Status: DC
Start: 2023-11-22 — End: 2023-12-24

## 2023-11-22 MED ORDER — BUPROPION HCL ER (XL) 150 MG PO TB24
150.0000 mg | ORAL_TABLET | Freq: Every morning | ORAL | 2 refills | Status: DC
Start: 2023-11-22 — End: 2024-01-14

## 2023-11-22 NOTE — Patient Instructions (Addendum)
 For the mood  - continue bupropion    - increase Lexapro  to 10 mg daily      For your left knee  You could try taking ibuprofen  400-600 mg two times per day for your knee, stop after 7-10 days   You can also continue Tylenol, 1000 mg up to 3 times per day   Can wear knee brace during the day

## 2023-11-22 NOTE — Progress Notes (Signed)
 PRIMARY CARE NOTE - FOLLOW-UP VISIT     SUBJECTIVE  Denise Gutierrez is a 68 year old English speaking female here for:    Chief concern(s): Follow-up    Today patient reports:    Last visit had left leg/ calf pain and asymmetric swelling  Ruled out DVT  Has been exerting herself more and is having left knee pain     OSA  Just started using CPAP for OSA   Using mask, has been getting good scores on her device     Mood   Describes mood as okay, but juggling multiple stressors   Taking bupropion  150 mg daily   Resumed Lexapro      Other stressors:  - brother cancer - bladder cancer, in [place]  - sister receiving radiation for thymoma (did not tolerate chemotherapy), also has CML  - grandson 80 had testicular torsion had to have orchiectomy     Traveled to [place] recently with family  Also took her granddaughter to [place]       ROS - Pertinent symptoms as noted above, other ROS negative    Active Medical Issues:  Patient Active Problem List:     Elevated BP without diagnosis of hypertension     Snoring     Toenail deformity     Insomnia     Major depressive disorder, recurrent, mild (HCC)     GAD (generalized anxiety disorder)     Other hyperlipidemia     Right knee pain     Stiffness of hand joint     Neck pain     Left knee pain     Left hip pain     Left shoulder pain     Elevated hemoglobin A1c     Allergic rhinitis     OSA (obstructive sleep apnea)     Barrett's esophagus without dysplasia    Past medical, surgical, and social history reviewed. Allergies reviewed.     Medications reviewed:  Current Outpatient Medications   Medication Instructions    ascorbic acid (VITAMIN C) 500 mg, DAILY    buPROPion  (WELLBUTRIN  XL) 150 mg, Oral, EVERY MORNING    diclofenac  (VOLTAREN ) 4 g, Topical, 4 TIMES DAILY    escitalopram  (LEXAPRO ) 10 mg, Oral, DAILY    fluticasone  (FLONASE ) 50 MCG/ACT nasal spray SPRAY 1 SPRAY INTO EACH NOSTRIL IN THE MORNING    Multiple Vitamins-Minerals (THERA-M) TABS 1 tablet, DAILY    pantoprazole  (PROTONIX ) 40  mg, Oral, DAILY    VITAMIN D  PO 600 mg, DAILY       OBJECTIVE  BP 129/82   Pulse 75   Temp 98.2 F (36.8 C)   Wt 76.2 kg (168 lb)   SpO2 96%   BMI 25.54 kg/m     Physical Exam  Gen: well appearing, pleasant, nad  Mmm, no scleral icterus  Breathing comfortably  Skin wwp  Gait normal   Appropriate mood and affect      I personally reviewed relevant labs, imaging, and studies in Epic[facility].     ASSESSMENT AND PLAN  [name] is a 68 year old female who presents with:    1. Major depressive disorder, recurrent, mild (HCC)  2. GAD (generalized anxiety disorder)  Mood improved  Continue bupropion    Increase Lexapro  from 5 to 10 mg daily     3. Left knee pain, unspecified chronicity  She has been referred to PT and will start soon     4. Elevated BP without diagnosis of  hypertension  At goal today without medication continue to monitor     5. Other hyperlipidemia  Experienced myalgias and elevated CK with statin - resolved with discontinuation   Has dyslipidemia with elevated LDL  Update lipids today   May need to consider ezetimibe  - pt also has been referred to cardiology   - LIPID PANEL    6. OSA (obstructive sleep apnea)  Tolerating CPAP, continue       Follow up in approximately 2-3 mos. ER and return precautions reviewed.     I spent a total of 32 minutes on this visit on the date of service (total time includes all activities performed on the date of service)  ____________    Corean Greenspan, DO    *If medications were prescribed today, we discussed Gaila Coover's new and current medications, including risks, benefits, and potential side effects. The patient expressed understanding and no barriers to adherence were identified.  Any tests ordered today were discussed with the patient.  The patient expressed understanding with plan.    Time spent on the day of the visit on some or all of the following activities:   Preparing to see the patient (eg, review of tests)   Obtaining and/or reviewing separately  obtained history   Performing a medically appropriate examination and/or evaluation   Counseling and educating the patient/family/caregiver   Ordering medications, tests, or procedures   Referring and communicating with other health care professionals    Documenting clinical information in the electronic or other health record   Independently interpreting results and  communicating results to the patient/family/caregiver   Care coordination

## 2023-12-12 ENCOUNTER — Ambulatory Visit (HOSPITAL_BASED_OUTPATIENT_CLINIC_OR_DEPARTMENT_OTHER): Payer: Self-pay | Admitting: Student in an Organized Health Care Education/Training Program

## 2023-12-15 IMAGING — CT CT HEAD W/O CM
4 series · 17 of 47 positions shown, 19 images · non-contrast
Comparison: None.

CLINICAL DATA: Status post trauma.



[Series 2: head wo · axial · 0.47mm/px · z∈[-134,-9]mm · 7 of 35 slices shown, 9 images]
[im 5/35  brain]
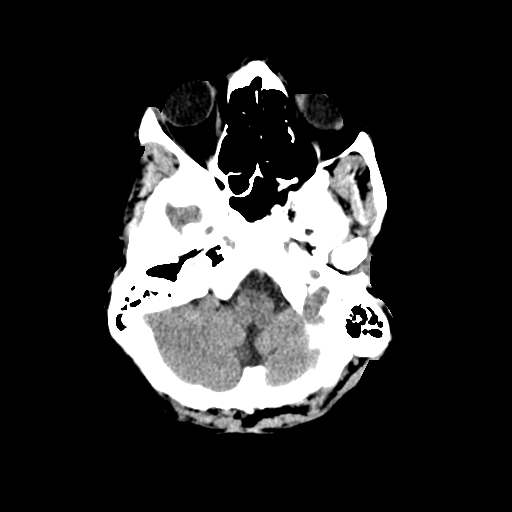
[im 5/35  bone]
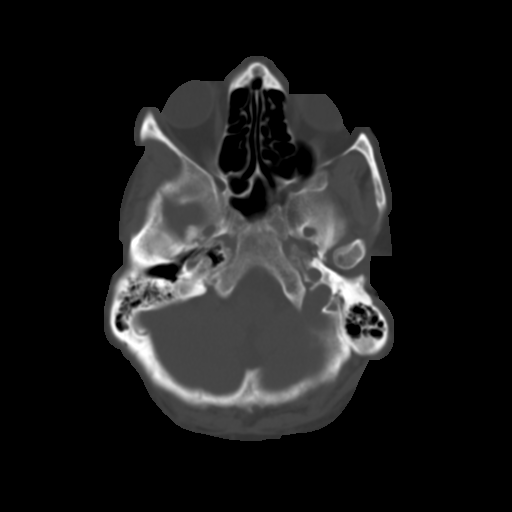
[im 9/35  brain]
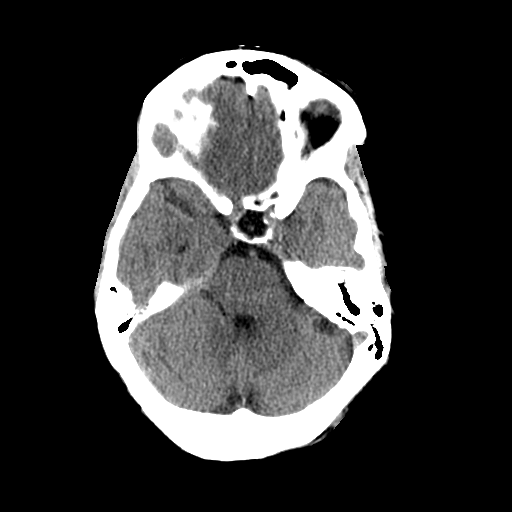
[im 13/35  brain]
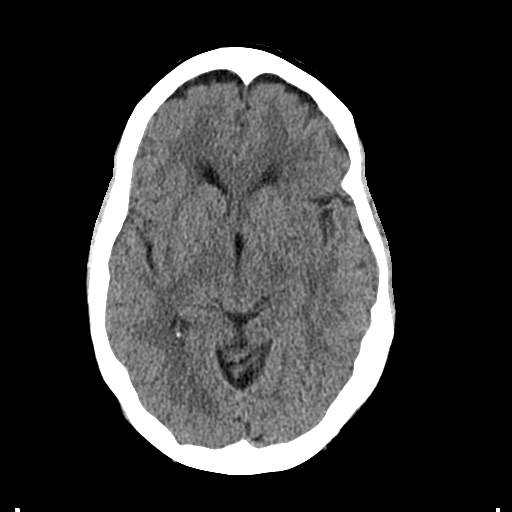
[im 18/35  brain]
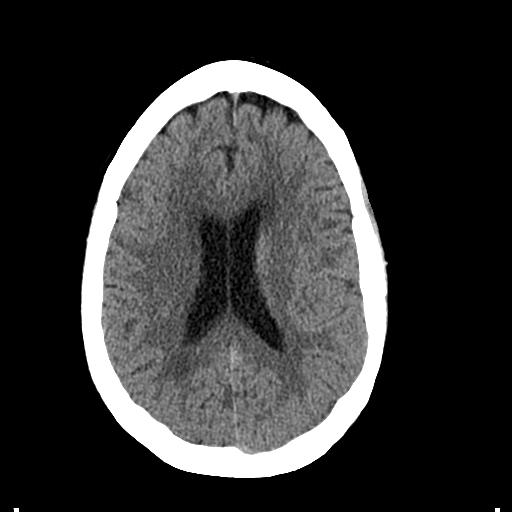
[im 22/35  brain]
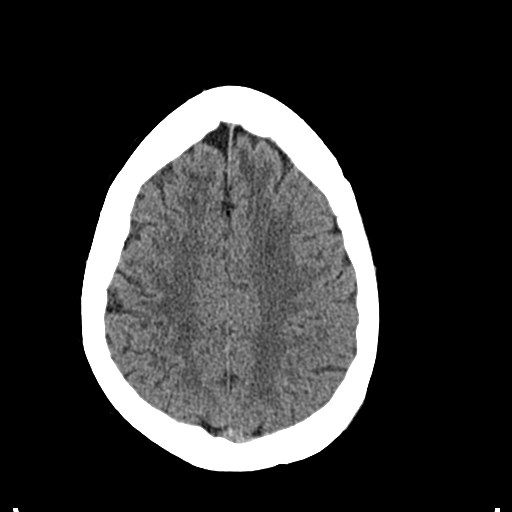
[im 22/35  bone]
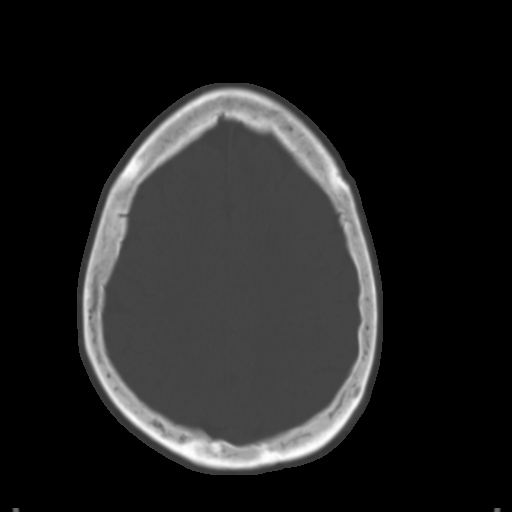
[im 26/35  brain]
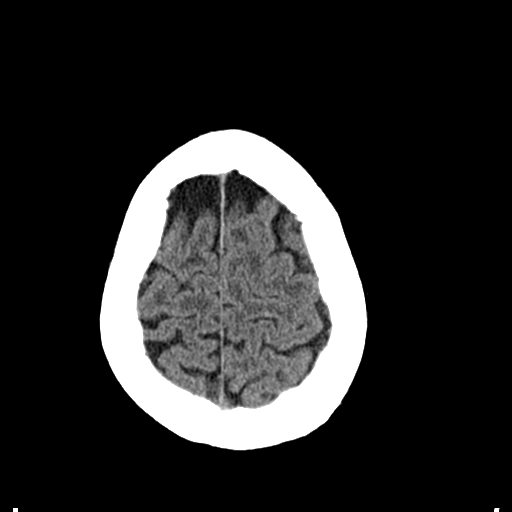
[im 30/35  brain]
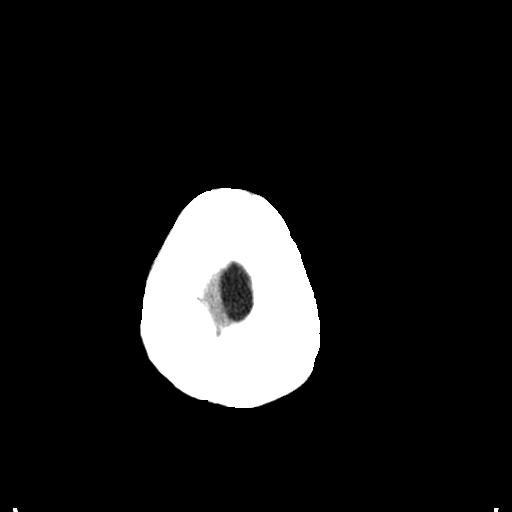

[Series 3: head bone · axial · 0.47mm/px · z∈[-138,-76]mm · 4 of 88 slices shown]
[im 9/88  bone]
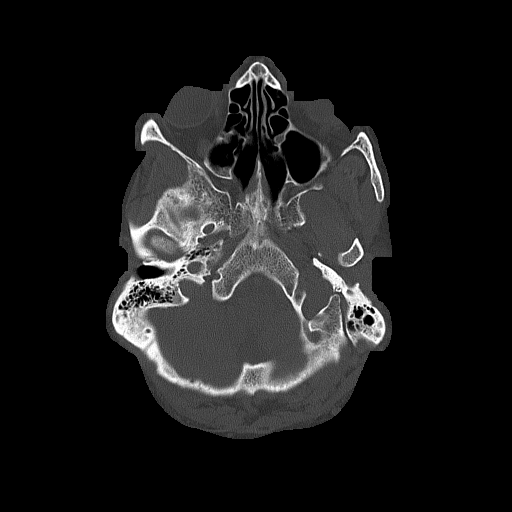
[im 18/88  bone]
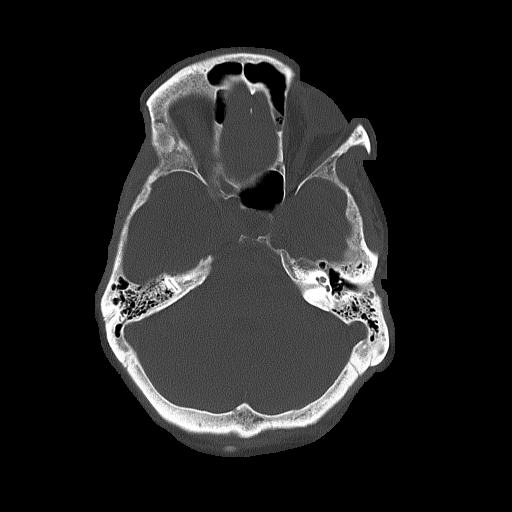
[im 27/88  bone]
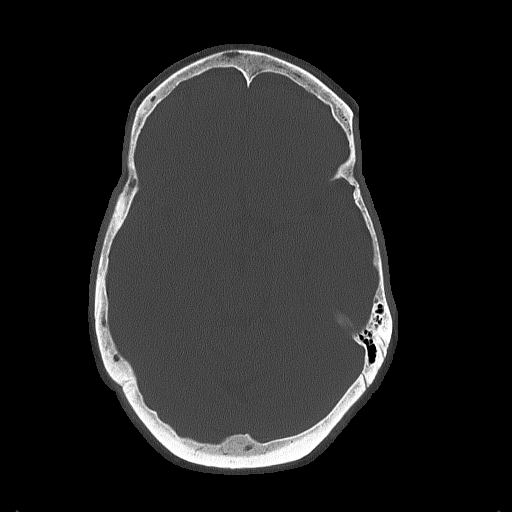
[im 40/88  bone]
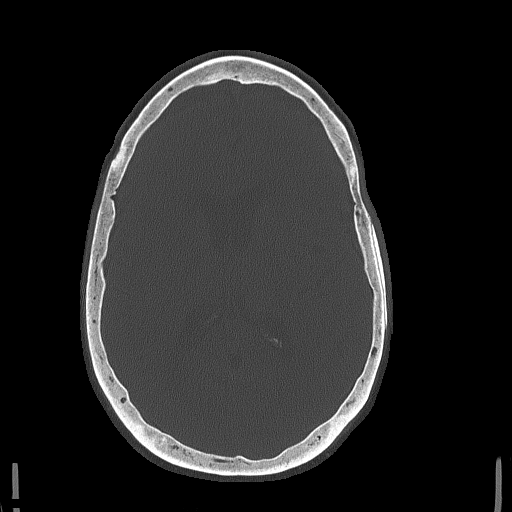

[Series 4: coronal soft tissue · coronal · 0.33mm/px · 3 of 70 slices shown]
[im 24/70  brain]
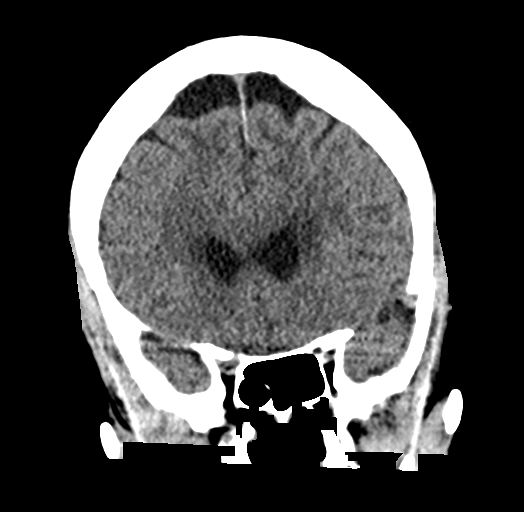
[im 31/70  brain]
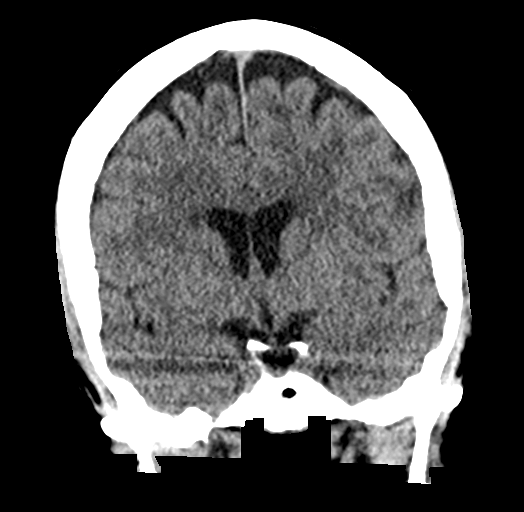
[im 39/70  brain]
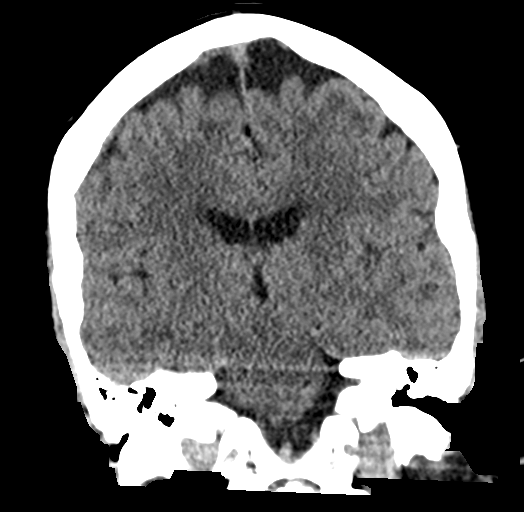

[Series 5: sagittal soft tissue · sagittal · 0.33mm/px · 3 of 53 slices shown]
[im 18/53  brain]
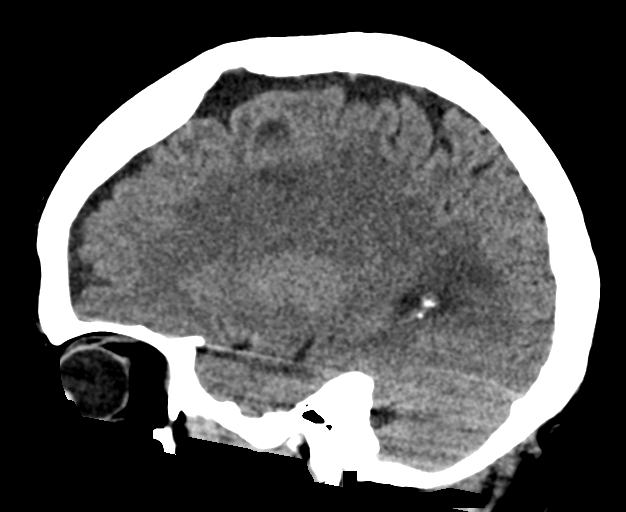
[im 27/53  brain]
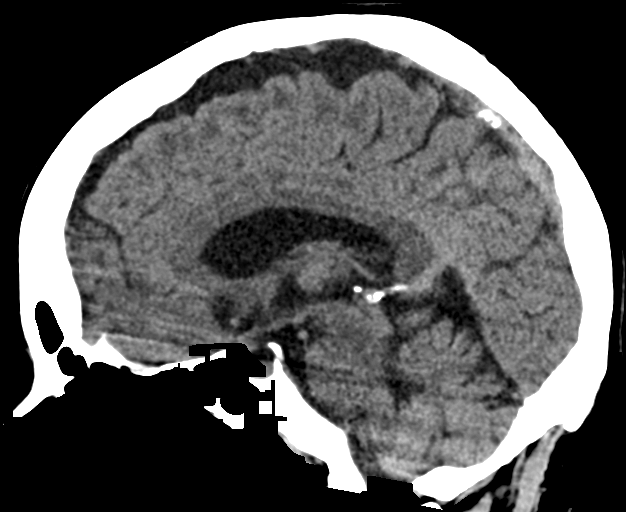
[im 35/53  brain]
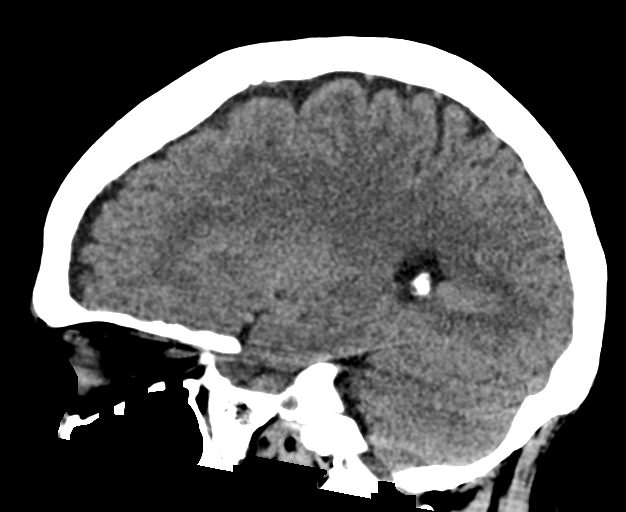

[17 of 47 positions shown; findings below may reference images not displayed]

FINDINGS: Brain: There is mild cerebral atrophy with widening of the
extra-axial spaces and ventricular dilatation.
There are areas of decreased attenuation within the white matter
tracts of the supratentorial brain, consistent with microvascular
disease changes.

Vascular: No hyperdense vessel or unexpected calcification.

Skull: Normal. Negative for fracture or focal lesion.

Sinuses/Orbits: No acute finding.

Other: None.
IMPRESSION: Generalized cerebral atrophy without evidence of an acute
intracranial abnormality.

## 2023-12-17 ENCOUNTER — Other Ambulatory Visit: Payer: Self-pay

## 2023-12-17 ENCOUNTER — Ambulatory Visit: Admitting: Internal Medicine

## 2023-12-17 ENCOUNTER — Ambulatory Visit
Admission: RE | Admit: 2023-12-17 | Discharge: 2023-12-17 | Disposition: A | Discharge status: Home / Self Care | Attending: Internal Medicine | Admitting: Internal Medicine

## 2023-12-17 ENCOUNTER — Encounter (HOSPITAL_BASED_OUTPATIENT_CLINIC_OR_DEPARTMENT_OTHER): Payer: Self-pay | Admitting: Internal Medicine

## 2023-12-17 VITALS — BP 133/74 | HR 64 | Temp 98.0°F | Wt 166.4 lb

## 2023-12-17 DIAGNOSIS — E7849 Other hyperlipidemia: Secondary | ICD-10-CM | POA: Diagnosis present

## 2023-12-17 LAB — LIPID PANEL
Cholesterol: 264 mg/dL — ABNORMAL HIGH (ref 0–239)
HIGH DENSITY LIPOPROTEIN: 61 mg/dL (ref 40–60)
LOW DENSITY LIPOPROTEIN DIRECT: 201 mg/dL — ABNORMAL HIGH (ref 0–189)
TRIGLYCERIDES: 123 mg/dL (ref 0–150)

## 2023-12-17 LAB — HEPATIC FUNCTION PANEL
ALANINE AMINOTRANSFERASE: 20 U/L (ref 12–45)
ALBUMIN: 4.4 g/dL (ref 3.4–5.2)
ALKALINE PHOSPHATASE: 131 U/L — ABNORMAL HIGH (ref 45–117)
ASPARTATE AMINOTRANSFERASE: 18 U/L (ref 8–34)
BILIRUBIN DIRECT: 0.2 mg/dL (ref 0.0–0.2)
BILIRUBIN TOTAL: 0.5 mg/dL (ref 0.2–1.0)
INDIRECT BILIRUBIN: 0.3 mg/dL (ref 0.2–0.9)
TOTAL PROTEIN: 6.8 g/dL (ref 6.4–8.2)

## 2023-12-17 LAB — CREATINE KINASE TOTAL: CREATINE KINASE TOTAL: 91 U/L (ref 26–192)

## 2023-12-17 MED ORDER — EZETIMIBE 10 MG PO TABS: 10.0000 mg | ORAL_TABLET | Freq: Every day | ORAL | 2 refills | Status: DC

## 2023-12-17 MED ORDER — EVOLOCUMAB 140 MG/ML SC SOAJ
140.0000 mg | SUBCUTANEOUS | 2 refills | Status: AC
Start: 2023-12-17 — End: 2024-03-16

## 2023-12-17 NOTE — Progress Notes (Signed)
 Cardiology Clinic Consult Note  Date of visit:  12/17/2023      Denise Gutierrez is a 68 year old female referred for consultation by Glendora Krabbe, DO for evaluation of possible familial hyperlipidemia.      History of present illness:    Pleasant 68 year old female who was born in [place] .  Currently resides in [place]  and is retired.  She used to work as a proof Chief Operating Officer.  Quite active at this point.  Has no symptoms.  Able to walk and do all her activities.  Denies any chest pain.  Denies any shortness of breath.  Denies any leg swelling, orthopnea or PND.    Here because of her marked hyperlipidemia and complete intolerance to statins in the past.    In 2024 was tried on atorvastatin  as well as rosuvastatin .  One of them gave her incessant diarrhea.  The other 1 caused marked elevation of CPK and diffuse muscle aches and pains.  She is now off all statins and does not even want to try another 1.    Patient Active Problem List:     Elevated BP without diagnosis of hypertension     Snoring     Toenail deformity     Insomnia     Major depressive disorder, recurrent, mild (HCC)     GAD (generalized anxiety disorder)     Other hyperlipidemia     Right knee pain     Stiffness of hand joint     Neck pain     Left knee pain     Left hip pain     Left shoulder pain     Elevated hemoglobin A1c     Allergic rhinitis     OSA (obstructive sleep apnea)     Barrett's esophagus without dysplasia      CURRENT MEDICATIONS  buPROPion  (WELLBUTRIN  XL) 150 MG 24 hr tablet, Take 1 tablet by mouth every morning, Disp: 30 tablet, Rfl: 2  escitalopram  (LEXAPRO ) 10 MG tablet, Take 1 tablet by mouth daily, Disp: 30 tablet, Rfl: 2  fluticasone  (FLONASE ) 50 MCG/ACT nasal spray, SPRAY 1 SPRAY INTO EACH NOSTRIL IN THE MORNING, Disp: 48 mL, Rfl: 0  Multiple Vitamins-Minerals (THERA-M) TABS, Take 1 tablet by mouth in the morning., Disp: , Rfl:   ascorbic acid (VITAMIN C) 500 MG tablet, Take 500 mg by mouth in the morning., Disp: , Rfl:    VITAMIN D  PO, Take 600 mg by mouth daily, Disp: , Rfl:     No current facility-administered medications on file prior to visit.      Orders Placed This Encounter      ezetimibe  (ZETIA ) 10 MG tablet      evolocumab  (REPATHA  SURECLICK) 140 MG/ML auto-injector      Review of Patient's Allergies indicates:   Contrast dye [iopam*    Hives    Comment:MRI    SOCIAL & FAMILY HISTORY: Does not smoke.  Social alcohol intake.    Markedly abnormal family history for vascular disease:  Mother had her myocardial infarction at the age of 61.  Subsequently had a CVA when she was 71.  Died at the age of 26.  Father had heart disease throughout his young life.  Died in his 52s.  Used to use nitroglycerin.  Brother who died in 4912 was 61 year old with due to cardiomyopathy.    ROS: All other systems reviewed were pertinently negative apart from the history of present illness.    PHYSICAL EXAM:  General: Well  built   12/17/23  1257   BP: 133/74   Site: Left Arm   Position: Sitting   Cuff Size: Regular   Pulse: 64   Temp: 98 F (36.7 C)   TempSrc: Temporal   SpO2: 97%   Weight: 75.5 kg (166 lb 6.4 oz)     HEENT: Oral mucosa is moist, no icterus, no pallor noted.  NECK: Supple, no jugular venous distention, no carotid bruits, no lymphadenopathy.  CHEST: Clear to auscultation, no crackles or rhonchi.  HEART: Apical impulse is not palpable. Heart sounds are normal. Regular rate and rhythm. No murmur, gallops, or rubs heard.  ABDOMEN: Soft, no organomegaly detected.  EXTREMITIES: Warm and well perfused. Bilateral lower limb pulses are palpable. There is no evidence of peripheral pedal edema.    NEURO: A&O X 3, no obvious motor or sensory deficits  PSYCHIATRIC: Mood and affect are appropriate.  SKIN: No rash observed    PERTINENT INVESTIGATIONS:  1. EKG: (on my personal review) 07/23/2023 sinus bradycardia otherwise normal EKG  2. LABS:   Lab Results   Component Value Date    NA 140 07/23/2023    K 4.4 07/23/2023    CL 104 07/23/2023     CO2 22 07/23/2023    BUN 18 07/23/2023    CREAT 0.7 07/23/2023    GLUCOSER 111 07/23/2023       Cholesterol (mg/dL)   Date Value   90/76/7974 264 (H)   11/22/2023 267 (H)   06/11/2023 272 (H)     LOW DENSITY LIPOPROTEIN DIRECT (mg/dL)   Date Value   90/76/7974 201 (H)   11/22/2023 202 (H)   06/11/2023 185     HIGH DENSITY LIPOPROTEIN (mg/dL)   Date Value   90/76/7974 61   11/22/2023 56   06/11/2023 58      NT-proBNP (pg/mL)   Date Value   09/20/2021 57     The 10-year ASCVD risk score (Arnett DK, et al., 2019) is: 8.9%    Values used to calculate the score:      Age: 53 years      Sex: Female      Is Non-Hispanic African American: No      Diabetic: No      Tobacco smoker: No      Systolic Blood Pressure: 133 mmHg      Is BP treated: No      HDL Cholesterol: 61 mg/dL      Total Cholesterol: 264 mg/dL     ASSESSMENT: 68 year old female with marked hyperlipidemia and complete intolerance to statins.  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  She has possible familial hyperlipidemia.  Her LDL cholesterol is 200 mg/dL.  After discussions I think we should target it around 100 to 130 mg/dL.  I am going to start her on ezetimibe  10 mg a day along with Repatha .  We will obtain a coronary calcium  score to risk stratify her because of her markedly abnormal family history of premature CAD  as well as vascular disease.  Get labs done for 4 weeks after starting Repatha  as well as ezetimibe .    Return for follow up in Cardiology Clinic in 6 months. To contact earlier if there are any cardiac related issues. I  have spent 45 minutes in face to face time with this patient of which more than 50% was spent in counseling and/or coordination of care regarding above issues and diagnosis.    Thank you for the privilege of involving me in this patient's care.  Please feel free to contact me if you have any questions.    This Cardiology Division patient encounter note was created using voice-recognition software and in real time during the clinic visit. Please excuse any typographical errors that have not been edited out.     Electronically signed by: Lillia Abler, MD, 12/17/2023 5:49 PM

## 2023-12-17 NOTE — Prior Authorization (Signed)
 Patient Insurance: [place] [employer]  Member ID: [id number]  BIN: [id number]  PCN: [id number]  Group: [id number]  Prior Authorization for: evolocumab  (REPATHA  SURECLICK) 140 MG/ML auto-injector      **NEW START INITIATION**       diagnosis of HYPERLIPIDEMIA (E78.49)         **Patient has been added to [facility] Clinical Onboarding. Please check if therapy is appropriate, thank you

## 2023-12-18 ENCOUNTER — Encounter (HOSPITAL_BASED_OUTPATIENT_CLINIC_OR_DEPARTMENT_OTHER): Payer: Self-pay

## 2023-12-18 ENCOUNTER — Ambulatory Visit (HOSPITAL_BASED_OUTPATIENT_CLINIC_OR_DEPARTMENT_OTHER): Payer: Self-pay | Admitting: Internal Medicine

## 2023-12-18 DIAGNOSIS — E7849 Other hyperlipidemia: Secondary | ICD-10-CM

## 2023-12-18 LAB — LIPOPROTEIN A: LIPOPROTEIN A: 21.1 nmol/L (ref <75.0)

## 2023-12-18 NOTE — Prior Authorization (Signed)
 Patient Insurance: [place] [employer]  Member ID: [id number]  BIN: [id number]  PCN: [id number]  Group: [id number]  Prior Authorization for: evolocumab  (REPATHA  SURECLICK) 140 MG/ML auto-injector      CMM:    Prior authorization was done online through Cover My Meds.  The following information was provided.     Waiting for the insurance company to provide an approval/denial notice.  This may take between 24 - 72 hours.     COVER MY MEDS KEY: [id number]

## 2023-12-18 NOTE — Progress Notes (Signed)
 Dear RN,    Please:    1. Create Telephone encounter for this patient.  2. Share with the patient the attached results : I had clearly asked to do these labs 3 weeks after starting Repatha  as well as ezetimibe .  I did not want them yesterday.  Please let her know I have ordered another set and she should do it 3 to 4 weeks after starting the drugs.    Plan:  1. Further tests are needed, future labs will be ordered and patient needs to return to have them done in 1 month(s). .    2. Type of Outreach: 1 phone call and if unable to reach send letter    3. Document the conversation in the Telephone Encounter and close the encounter, no need to send back to me.     Thank you,  Lillia Abler, MD

## 2023-12-18 NOTE — Progress Notes (Signed)
 Pt has been set up with CPAP by [facility] on 11/20/23.

## 2023-12-19 NOTE — Prior Authorization (Signed)
 CMM:    Prior authorization was done online through Cover My Meds.  The following information was provided.     Waiting for the insurance company to provide an approval/denial notice.  This may take between 24 - 72 hours.     COVER MY MEDS KEY: [id number]                Status:    Prior Authorization Status: Approved  Type: Pharmacy       Pharmacy:    Start Date: 11/19/23  End Date: 12/18/25  PA #: [id number]     The patient and their preferred pharmacy are aware of the approval.  Approval notice has been scanned into media manager.

## 2023-12-20 ENCOUNTER — Encounter (HOSPITAL_BASED_OUTPATIENT_CLINIC_OR_DEPARTMENT_OTHER): Payer: Self-pay | Admitting: Critical Care Medicine

## 2023-12-20 ENCOUNTER — Ambulatory Visit: Attending: Critical Care Medicine | Admitting: Critical Care Medicine

## 2023-12-20 ENCOUNTER — Other Ambulatory Visit: Payer: Self-pay

## 2023-12-20 VITALS — BP 137/78 | HR 67 | Temp 98.6°F | Ht 68.0 in | Wt 163.0 lb

## 2023-12-20 DIAGNOSIS — G4733 Obstructive sleep apnea (adult) (pediatric): Secondary | ICD-10-CM | POA: Insufficient documentation

## 2023-12-21 ENCOUNTER — Encounter (HOSPITAL_BASED_OUTPATIENT_CLINIC_OR_DEPARTMENT_OTHER): Payer: Self-pay | Admitting: Critical Care Medicine

## 2023-12-21 NOTE — Progress Notes (Signed)
 Reason for Visit:   The patient consents to have this visit recorded and transcribed using AI and Abridge technology.  Patient understands that this recording will not become part of the permanent medical record.      Sleep Apnea (Follow up after starting CPAP )        HPI:    History of Present Illness  The patient presents fol follow up regarding CPAP machine usage and its impact on her sleep quality.      She has been using the CPAP machine for about a month with full compliance, wearing it every night for approximately nine hours. The plastic of the mask sometimes causes discomfort, especially since she sleeps on her stomach. Despite this, she feels that the CPAP is helping her sleep better, although she has also had increased stress due to family illness and she thinks this I also impacting her sleep.     She has a history of sinusitis, which she believes contributes to her breathing issues. No increase in nasal discharge, epistaxis, or skin problems since using the CPAP. She cleans the mask daily and the hose weekly, using a brush to clean the hose.    Her sleep schedule involves going to bed around 10:30 PM and waking up between 7:00 and 8:00 AM. She sometimes removes the mask if she wakes up early and plans to stay in bed longer. She uses a humidifier in her room, especially in winter, and uses distilled water in the CPAP machine. She has been tracking her sleep with a Fitbit, which shows improved sleep scores recently.  She also follows her progress with My [employer] .      She has a family history of respiratory issues and high cholesterol. Her brother is currently undergoing chemotherapy for bladder cancer, and her sister has chronic myeloid leukemia and is legally blind. Her brother's situation has affected her activity levels and sleep quality.    She recently saw a cardiologist due to high cholesterol and was prescribed Zetia . She is also trying to get approval for Repatha . She has a family history of high  cholesterol, with her brother and sister also affected, and a brother who died at 51 from cardiomyopathy. Her mother had a heart attack and stroke, and her father had heart disease and prostate cancer.  For these reasons she is trying to be very proactive regarding her sleep apnea treatment     She occasionally takes Ambien , which was prescribed to her, but has not used it regularly. Her weight has remained stable.  She denies any other medical changes.   She drinks coffee in the morning and not later in the day.      Her only issue with the CPAP is that sometimes the pressure might be a bit high.  She is on APAP 5-15 currently.  Her download was reviewed.                     Review of System:     Review of Systems    Weight up about 10 pounds/HEENT denies any grinding or clenching but does wake up with some sore throat chest denies waking up with any shortness of breath coughing or gasping cardiac does not wake up with any palpitations, abdomen does have a history of some GERD but this is not waking her up extremities no pain or leg movements waking up, no issues with PAP.    Baseline ESS 4.     Family  history:    Review of patient's family history indicates:  Problem: Heart Disease      Relation: Mother          Age of Onset: (Not Specified)          Comment: died at 39 from CVA?  Problem: Stroke      Relation: Mother          Age of Onset: (Not Specified)          Comment: 83  Problem: Cancer - Prostate      Relation: Father          Age of Onset: 85          Comment: 31 from prostate cancer  Problem: Heart Disease      Relation: Father          Age of Onset: (Not Specified)          Comment: angina  Problem: Cancer - Lung      Relation: Sister          Age of Onset: (Not Specified)          Comment: smoker 6, was a 9/11 first responder  Problem: Cancer - Other      Relation: Sister          Age of Onset: (Not Specified)          Comment: leukmeia (CML) on Gleevec, thymus cancer  Problem: High Cholesterol       Relation: Sister          Age of Onset: (Not Specified)  Problem: Retinal Detachment      Relation: Sister          Age of Onset: (Not Specified)  Problem: Diabetes      Relation: Brother          Age of Onset: (Not Specified)  Problem: Heart      Relation: Brother          Age of Onset: (Not Specified)          Comment: cariomypathy died at 67  Problem: Diabetes      Relation: Brother          Age of Onset: (Not Specified)  Problem: High Cholesterol      Relation: Brother          Age of Onset: (Not Specified)  Problem: Cancer - Other      Relation: Brother          Age of Onset: (Not Specified)          Comment: bladder cancer  Problem: Retinal Detachment      Relation: Brother          Age of Onset: (Not Specified)  Problem: OTHER (oth)      Relation: Granddaughter          Age of Onset: (Not Specified)          Comment: Autism  Problem: Cancer - Colon      Relation: FamHxNeg          Age of Onset: (Not Specified)  Problem: Cancer - Cervical      Relation: FamHxNeg          Age of Onset: (Not Specified)  Problem: Cancer - Breast      Relation: FamHxNeg          Age of Onset: (Not Specified)  Problem: Cancer - Ovarian      Relation: FamHxNeg  Age of Onset: (Not Specified)  Problem: Alcohol/Drug Abuse      Relation: FamHxNeg          Age of Onset: (Not Specified)          Social history:     Social History     Socioeconomic History    Marital status: Single     Spouse name: Not on file    Number of children: Not on file    Years of education: Not on file    Highest education level: Not on file   Occupational History    Not on file   Tobacco Use    Smoking status: Never    Smokeless tobacco: Never   Substance and Sexual Activity    Alcohol use: Yes     Comment: 1 drink 3-4 days a week    Drug use: Yes     Comment: once every 2-3 weeks, edible and vaping    Sexual activity: Not on file   Other Topics Concern    Not on file   Social History Narrative    July 2024    Had her son age 5    Values her  independence, has lived by herself for a long time     Retired since 2020.     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     Fluent in Spanish    Was one of 6 kids, 2 siblings have died. Albina is in the middle.     Her parents are [place].     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 [employer]   Social Drivers of Catering manager Strain: Not on [employer] Insecurity: Not on file  Transportation Needs: Not on file  Physical Activity: Not on file  Stress: Not on file  Social Connections: Not on file  Intimate Partner Violence: Not on file  Housing Stability: Not on file      Home situation: no changes     Occupation and work exposures: retired     Social History    Tobacco Use      Smoking status: Never      Smokeless tobacco: Never        Alcohol use   Yes      Comment:   1 drink 3-4 days a week              Drug use:         Comment:   once every 2-3 weeks, edible and vaping        Exercise habits: walking     PMH:  Past Medical History:  No date: Depression  No date: Elevated cholesterol  No date: HTN (hypertension)  Moderate sleep apnea    Allergies-   Review of Patient's Allergies indicates:   Contrast dye [iopam*    Hives    Comment:MRI  Current Outpatient Medications   Medication Sig    ezetimibe  (ZETIA ) 10 MG tablet Take 1 tablet by mouth daily    evolocumab  (REPATHA  SURECLICK) 140 MG/ML auto-injector Inject 1 mL under the skin every 14 (fourteen) days    buPROPion   (WELLBUTRIN  XL) 150 MG 24 hr tablet Take 1 tablet by mouth every morning    escitalopram  (LEXAPRO ) 10 MG tablet Take 1 tablet by mouth daily    fluticasone  (FLONASE ) 50 MCG/ACT nasal spray SPRAY 1 SPRAY INTO EACH NOSTRIL IN THE MORNING    Multiple Vitamins-Minerals (THERA-M) TABS Take 1 tablet by mouth in the morning.    ascorbic acid (VITAMIN C) 500 MG tablet Take 500 mg by mouth in the morning.    VITAMIN D  PO Take 600 mg by mouth daily     No current facility-administered medications for this visit.       Physical exam  BP 137/78 (Site: RA, Position: Sitting, Cuff Size: Reg)   Pulse 67   Temp 98.6 F (37 C) (Temporal)   Ht 5' 8 (1.727 m)   Wt 73.9 kg (163 lb)   SpO2 97%   BMI 24.52 kg/m   68 year old female    Voice: Normal   Face: No lesions     HEENT: Sclera: anicteric Neck: Supple; no JVD; trachea midline,  Mallampati score:  2  Facial trauma from interface ( if using CPAP) : no  Nose:  Septum midline.  No polyps or masses. External view   Oral Cavity/Oropharynx:  Mucous membranes:  moist.  Tonsils:  not seen Teeth: no issues   Tongue:    Cardiac: heart sounds  S1 S2 regular, no murmurs  Chest: Clear lungs to auscultation and percussion   Abdomen: soft, non tender, no masses palpable   Ext: without edema, clubbing, cyanosis  Neurologic: Alert & oriented x 3, No focal deficits noted.   Gait: Normal   Psychiatry: Affect normal                  Sleep data:      Sleep testing 2025  AHI 27 and nadir saturation 74%      Assessment:        OSA on CPAP  (primary encounter diagnosis)    Compliance report reviewed during visit:  excellent compliance and mask fit,  AHI is < 1.  Likely can decrease pressure as discussed with patient.    Assessment & Plan  Obstructive sleep apnea  Obstructive sleep apnea well-managed with CPAP. 100% usage, 9 hours/night. AHI reduced from 27 to <1. Minor nasal mask annoyance, no significant issues. Current pressure settings 5-15 cm H2O, 95th percentile at 7.3 cm H2O.  - Adjust CPAP  pressure settings to 5-12 cm H2O remotely.  - Ensure monthly replacement of nose pieces, headgear every six months, and filters as needed.  - Request replacement of foam filter for CPAP machine.  - Schedule annual follow-up for CPAP therapy and sleep apnea management.  - good sleep hygiene reviewed  - pt is aware of driving risks with untreated OSA       Plan:    As above       I spent a total of 25 minutes on  this visit on the date of service (total time includes all activities performed on the date of service)      Comer Saltness, MD  12/21/2023

## 2023-12-24 ENCOUNTER — Other Ambulatory Visit (HOSPITAL_BASED_OUTPATIENT_CLINIC_OR_DEPARTMENT_OTHER): Payer: Self-pay | Admitting: Student in an Organized Health Care Education/Training Program

## 2023-12-24 NOTE — Telephone Encounter (Signed)
 PER who is calling: Pharmacy, Krystiana Fornes is a 68 year old female has requested a refill of       lexapro         Last Office Visit  : 11/22/2023 Glendora Krabbe, DO    Last Tele Visit:  07/22/2023    Last Physical Exam:  01/05/2020    There are no preventive care reminders to display for this patient.    Other Med Adult:  Most Recent BP Reading(s)  12/20/23 : 137/78        Cholesterol (mg/dL)   Date Value   90/76/7974 264 (H)     LOW DENSITY LIPOPROTEIN DIRECT (mg/dL)   Date Value   90/76/7974 201 (H)     HIGH DENSITY LIPOPROTEIN (mg/dL)   Date Value   90/76/7974 61     TRIGLYCERIDES (mg/dL)   Date Value   90/76/7974 123         THYROID  SCREEN TSH REFLEX FT4 (uIU/mL)   Date Value   07/03/2022 2.520         TSH (THYROID  STIM HORMONE) (uIU/mL)   Date Value   09/20/2021 2.940       HEMOGLOBIN A1C (%)   Date Value   06/11/2023 5.7 (H)       No results found for: POCA1C      INR (no units)   Date Value   09/20/2021 1.0   07/05/2021 1.0   03/13/2021 1.1       SODIUM (mmol/L)   Date Value   07/23/2023 140       POTASSIUM (mmol/L)   Date Value   07/23/2023 4.4           CREATININE (mg/dL)   Date Value   95/70/7974 0.7       Documented patient preferred pharmacies:      [id number] - [place], [place] - [address]  [place]  Phone: [phone] Fax: [phone]

## 2023-12-25 ENCOUNTER — Telehealth (HOSPITAL_BASED_OUTPATIENT_CLINIC_OR_DEPARTMENT_OTHER): Payer: Self-pay | Admitting: Internal Medicine

## 2023-12-25 ENCOUNTER — Other Ambulatory Visit (HOSPITAL_BASED_OUTPATIENT_CLINIC_OR_DEPARTMENT_OTHER): Payer: Self-pay | Admitting: Student in an Organized Health Care Education/Training Program

## 2023-12-25 NOTE — Telephone Encounter (Signed)
 Denise Gutierrez is a 68 year old female presenting to the pharmacy for injection training.   Drug Therapy: Repatha  sureclick  Translator used: No  Drug specific administration and storage instructions: In the fridge once arrived at home; can be left out of fridge for up to 30 days  Do you have a latex allergy? No  Demo device available: No  Instructions for Injection: Inject 1 dose under the skin every 14 days  Video training provided to the patient: Yes  Did the patient complete first injection successfully? Yes  Injection site: stomach  Monitoring at pharmacy: 15 minutes in the waiting room to monitor for any allergic reaction  Side effects from injection/injection site reactions: None  Barriers for use: No  Important Counseling:   Remember to rotate injection sites. Avoid injecting in damaged tissue (e.g. scars, tattoos, stretch marks)   The following are signs of a significant reaction (e.g wheezing, chest tightness, fever; itching, bad cough, blue skin color, seizures or swelling of face, lips, tongue, or throat). Please call 911.   Used injection device should be tossed in a sharps container - do not throw in the trash can. When full, contact pharmacy for disposal locations in the area.   Educate patient on the importance of rotating injection site.   [email or web address] to find a site.

## 2023-12-25 NOTE — Telephone Encounter (Signed)
 PER who is calling: Pharmacy, Denise Gutierrez is a 68 year old female has requested a refill of       flonase .            Last Office Visit  : 11/22/2023 Glendora Krabbe, DO      Last Tele Visit:  07/22/2023      Last Physical Exam:  01/05/2020    There are no preventive care reminders to display for this patient.    Other Med Adult:  Most Recent BP Reading(s)  12/20/23 : 137/78        Cholesterol (mg/dL)   Date Value   90/76/7974 264 (H)     LOW DENSITY LIPOPROTEIN DIRECT (mg/dL)   Date Value   90/76/7974 201 (H)     HIGH DENSITY LIPOPROTEIN (mg/dL)   Date Value   90/76/7974 61     TRIGLYCERIDES (mg/dL)   Date Value   90/76/7974 123         THYROID  SCREEN TSH REFLEX FT4 (uIU/mL)   Date Value   07/03/2022 2.520         TSH (THYROID  STIM HORMONE) (uIU/mL)   Date Value   09/20/2021 2.940       HEMOGLOBIN A1C (%)   Date Value   06/11/2023 5.7 (H)       No results found for: POCA1C      INR (no units)   Date Value   09/20/2021 1.0   07/05/2021 1.0   03/13/2021 1.1       SODIUM (mmol/L)   Date Value   07/23/2023 140       POTASSIUM (mmol/L)   Date Value   07/23/2023 4.4           CREATININE (mg/dL)   Date Value   95/70/7974 0.7       Documented patient preferred pharmacies:    [id number] [place], [place] - [address]  Phone: [phone] Fax: [phone]

## 2023-12-30 ENCOUNTER — Ambulatory Visit (HOSPITAL_BASED_OUTPATIENT_CLINIC_OR_DEPARTMENT_OTHER)

## 2023-12-30 ENCOUNTER — Other Ambulatory Visit: Payer: Self-pay

## 2023-12-30 DIAGNOSIS — S76312S Strain of muscle, fascia and tendon of the posterior muscle group at thigh level, left thigh, sequela: Secondary | ICD-10-CM | POA: Insufficient documentation

## 2023-12-30 DIAGNOSIS — M25562 Pain in left knee: Secondary | ICD-10-CM | POA: Diagnosis present

## 2023-12-30 NOTE — Progress Notes (Unsigned)
 Outpatient Physical Therapy Initial Evaluation  [facility]: [facility]    Patient Name: Denise Gutierrez  DOB: 1956-03-18  Referring Provider: Dr Quinten Lenis, MD  Diagnosis: Hamstring muscle strain, left, sequela   ICD-10: S76.312S   Date of Onset: Acute on Chronic    Subjective:   - Pt says that she has been having L knee pain x chronic. Off and on pain  - 2 months ago: she was trying to move her mattress around and felt increase in knee pain. Since then L knee has been hurting non stop  - H/O: meniscal repair done x 10 yrs ago. Rehab was not very helpful. Had 3 bunion sx: 6-7 yrs ago due to repeated hardware issues.   - says that pain present: 7/10 all the time, pain on lateral and posterior aspect of knee L present.  - Type of pain and intensity: feel like there is a rod on the L lat side  - Clicking, locking, instability noted: - , swelling: off and on swelling noted on L  - Lifestyle: active: walks around a lot, tries to take 10 K steps per day  - Has been referred to PT for further eval.    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    Pre-morbid Functional Level: off and on knee pain in the past, had meniscal repair long time ago  Functional Deficits: lat knee pain, pain when she is laying down for long periods of time, walking: no problem, stairs: hurts    Occupation: retired  Dressing/Grooming: no pain  Driving: no pain  Sleeping: pain ++    Contraindications/Precautions:     Elevated BP without diagnosis of hypertension  H/O L knee meniscal repair and L bunion sx     Other hyperlipidemia    PMH:   Patient Active Problem List:     Elevated BP without diagnosis of hypertension     Snoring     Toenail deformity     Insomnia     Major depressive disorder, recurrent, mild (HCC)     GAD (generalized anxiety disorder)     Other hyperlipidemia     Right knee pain     Stiffness of hand joint     Neck pain     Left knee pain     Left hip pain     Left shoulder pain      Elevated hemoglobin A1c     Allergic rhinitis     OSA (obstructive sleep apnea)     Barrett's esophagus without dysplasia    Past Medical History:  No date: Depression  No date: Elevated cholesterol  No date: HTN (hypertension)    Medications:  fluticasone  (FLONASE ) 50 MCG/ACT nasal spray, SPRAY 1 SPRAY INTO EACH NOSTRIL IN THE MORNING, Disp: 48 mL, Rfl: 0  escitalopram  (LEXAPRO ) 10 MG tablet, TAKE 1 TABLET BY MOUTH EVERY DAY IN THE MORNING, Disp: 30 tablet, Rfl: 2  ezetimibe  (ZETIA ) 10 MG tablet, Take 1 tablet by mouth daily, Disp: 30 tablet, Rfl: 2  evolocumab  (REPATHA  SURECLICK) 140 MG/ML auto-injector, Inject 1 mL under the skin every 14 (fourteen) days, Disp: 2 mL, Rfl: 2  buPROPion  (WELLBUTRIN  XL) 150 MG 24 hr tablet, Take 1 tablet by mouth every morning, Disp: 30 tablet, Rfl: 2  Multiple Vitamins-Minerals (THERA-M) TABS, Take 1 tablet by mouth in the morning., Disp: , Rfl:   ascorbic acid (VITAMIN C) 500 MG tablet, Take 500 mg by mouth in the morning., Disp: ,  Rfl:   VITAMIN D  PO, Take 600 mg by mouth daily, Disp: , Rfl:     No current facility-administered medications on file prior to visit.      Mental Status/Communication: WNL  Learns Best: verbal, written, demonstration  Patient Goals: being able to achieve functional goals as stated above    Objective:   Please Note: Only populated fields were assessed by provider, fields left blank were not assessed. * denotes pain with testing.  Posture: normal  Gait: decreased stride and step length, decreased knee ext with HS and push off  Tenderness to Palpation: increased TTP noted on HS posterior  Increased Tissue Density: L HS, slightly also on IT band  Joint Mobility: 3/6 R, 3/6 L    Knee Girth: No measurable swelling   Joint Line:  R,  L   2 inches Infrapatellar:  R,  L   2 inches Suprapatellar:  R,  L     RIGHT A/PROM LEFT A/PROM   HIP       FLEX       EXT       IR 40 30     ER 45 52   KNEE       FLEX 140 130 pain/140 no pain     EXT -4 -4/-4      RIGHT  STRENGTH LEFT STRENGTH   HIP       FLEX       EXT       ABD     KNEE       FLEX 5 4+     EXT 5 5   ANKLE       DF       PF       INV       EV       Neurological Exam: WNL   Reflexes:  R,  L   Sensation:  R,  L   Myotomes:  R,  L    Muscle length:   Prone quad:  R: -, L: -  HS length:  R: -, L: +  SLR: R: Normal , L: normal  Hip flexor (modified Thomas Test): R: N , L: N    Special Tests:   Ant drawer's: -  Lachman's: -  Post drawer's: [place] Johnita Elbe: -  Medial and lat collateral lig stress test: -  Clarke's test: -    Balance: NT today    Plan of Care  Referring Provider: Dr Quinten Lenis, MD  Diagnosis: Hamstring muscle strain, left, sequela   ICD-10: S76.312S     Assessment:  Pt is a 68 year old female with complains of pain on L post HS. Pt presents today with the following problems and impairments: pain, decreased ROM of knee flexion with pain, increased tightness on L HS, decreased strength of knee, impaired PLOF. These are limiting the patient with the following functional activities: lat knee pain, pain when she is laying down for long periods of time, walking: no problem, stairs: hurts. Clinical presentation today is consistent with diagnosis. Patient with benefit from skilled Physical Therapy focusing on improving current impairments to assist in return to prior level of function.    Co-morbidities were identified and taken into considerations of plan of care.    12/30/23  Short Term Goals: 4 weeks  Decrease L HS tightness by 50% and thereby improve mobility  Improve B/L Knee strength to 5/5 and improve gait  Increased pt compliance with HEP  Enable patient to negotiate 2 flights of stairs with less pain    Long Term Goals: 8 weeks  Decrease L HS tightness by 100% and thereby improve mobility  Enable patient to walk >30 min with less pain  3. Enable patient to negotiate 5 flights of stairs with less pain  4. Improve overall balance w/ EO >15 sec on L    Plan:  ** PT Eval - Low Complexity (CPT 97161)  **  Stretching/ROM/Therapeutic Exercise (97110)  ** Neuromuscular Re-education (CPT [id number])  ** Manual Therapy / Joint / Soft tissue Mobilization (CPT 97140)  ** Electrical Stimulation/TENS (CPT 97014)  ** Hot/Cold Rx (CPT 97010)  ** Gait Training (CPT [id number])  ** Functional Activities (CPT [phone])    Recommend skilled Physical Therapy services for 1 times per week for 8 weeks. Updated plan of care will be completed every 30 days.    The rehabilitation potential for this patient is good. Clinical presentation is stable and uncomplicated d/t pt's understanding, pt's willingness to be c/ w/ HEP, subjective and objective measures during eval..      Patient [name] is aware of attendance policy: Yes  Plan of care discussed with Patient/Family: Yes  Patient goals reviewed and incorporated in plan of care: Yes  Patient/Family agrees with plan of care: Yes  Patient/Family education: Yes  Patient feels safe at home: Yes    Thank you for your referral. Please feel free to contact me with any questions or concerns.    Kimberlee Shoun Kavacheri Subramaniam, PT, Lic # [id number]

## 2023-12-31 NOTE — Progress Notes (Signed)
 12/30/23 1300   Language Information   Additional Comments L HS STRAIN   Precautions   Precautions General Precautions   Comments Elevated BP without diagnosis of hypertension  H/O L knee meniscal repair and L bunion sx     Other hyperlipidemia   Rehab Discipline   Rehab Discipline PT   Visit   Visit number 1   Recert Due Date  01/29/24   Time Calculation   Start Time 1100   Stop Time 1150   Time Calculation (min) 50 min   Ther Exercise   Exercise HS STRETCH IN DIFF POSITIONS   Patient Education   What was taught? [employer] CODE: [email or web address]   Method Verbal;Demo;Practice;Written   Patient comprehension Yes       Arlena Sallie Chute, PT, Lic # [id number]

## 2023-12-31 NOTE — Progress Notes (Signed)
 I certify that the documented Treatment Plan is reasonable and necessary.    12/31/2023  Tinnie Mew, PA-C

## 2024-01-02 ENCOUNTER — Ambulatory Visit (HOSPITAL_BASED_OUTPATIENT_CLINIC_OR_DEPARTMENT_OTHER)

## 2024-01-02 ENCOUNTER — Encounter (HOSPITAL_BASED_OUTPATIENT_CLINIC_OR_DEPARTMENT_OTHER): Payer: Self-pay | Admitting: Dermatology

## 2024-01-02 ENCOUNTER — Ambulatory Visit: Attending: Dermatology | Admitting: Dermatology

## 2024-01-02 ENCOUNTER — Other Ambulatory Visit: Payer: Self-pay

## 2024-01-02 DIAGNOSIS — L720 Epidermal cyst: Secondary | ICD-10-CM | POA: Insufficient documentation

## 2024-01-02 DIAGNOSIS — L738 Other specified follicular disorders: Secondary | ICD-10-CM | POA: Insufficient documentation

## 2024-01-02 DIAGNOSIS — L821 Other seborrheic keratosis: Secondary | ICD-10-CM | POA: Insufficient documentation

## 2024-01-02 DIAGNOSIS — D485 Neoplasm of uncertain behavior of skin: Secondary | ICD-10-CM | POA: Diagnosis present

## 2024-01-02 NOTE — Progress Notes (Signed)
 Sent by Glendora Krabbe, DO     HPI:   Denise Gutierrez is a 68 year old female who presents for: right breast itching, several open comedones, mole under nipple that is bothersome (suspect SK for mole)   History of Present Illness  The patient presents with a spot on her breast and facial lesions for dermatological evaluation. She was referred by her doctor for evaluation of a spot on her breast.    She has had a spot underneath her breast for approximately seven years. It is not itchy or painful and does not cause significant discomfort. There is no personal or family history of skin cancer. She has used a tanning bed once and has never had a blistering sunburn.    She is interested in having facial lesions removed, which occasionally cause slight itching. A previous attempt to remove a lesion on her right chest was unsuccessful. The cyst is not currently inflamed or painful.      No other dermatologic complaints or concerns today.    Past Medical History:  Past Medical History:  No date: Depression  No date: Elevated cholesterol  No date: HTN (hypertension)    Current Medications:  Current Outpatient Medications   Medication Sig    fluticasone  (FLONASE ) 50 MCG/ACT nasal spray SPRAY 1 SPRAY INTO EACH NOSTRIL IN THE MORNING    escitalopram  (LEXAPRO ) 10 MG tablet TAKE 1 TABLET BY MOUTH EVERY DAY IN THE MORNING    ezetimibe  (ZETIA ) 10 MG tablet Take 1 tablet by mouth daily    evolocumab  (REPATHA  SURECLICK) 140 MG/ML auto-injector Inject 1 mL under the skin every 14 (fourteen) days    buPROPion  (WELLBUTRIN  XL) 150 MG 24 hr tablet Take 1 tablet by mouth every morning    Multiple Vitamins-Minerals (THERA-M) TABS Take 1 tablet by mouth in the morning.    ascorbic acid (VITAMIN C) 500 MG tablet Take 500 mg by mouth in the morning.    VITAMIN D  PO Take 600 mg by mouth daily     No current facility-administered medications for this visit.       Allergies:  Review of Patient's Allergies indicates:   Contrast dye [iopam*     Hives    Comment:MRI        Physical examination:    Examination of the following areas were performed:    [x]  Head/Neck  [x]  Chest    Notable exam findings:  Physical Exam  - numerous skin colored shiny papules with central yellow globules on dermoscopy scattered on face, R>L  - R lower cheek with brown waxy papule also with yellow globules (to be biopsied/removed)  - R chest with semimobile skin colored nodule with central keratin plug  - R inframammary with stuck on brown waxy plaque with comedolike openings      Reviewed outside medical records or labs by other provider:   No      Assessment & Plan  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 The larger lesion is bothersome. There is no concern for malignancy, but a definitive diagnosis cannot be made visually.  - Schedule non-urgent appointment with PA for shave biopsy of the large brown lesion on the right cheek.  - Advise to send a message if the spot on the right cheek starts growing rapidly before the appointment, in which sooner appt  will be scheduled to r/o malignancy.    Seborrheic keratosis of right breast  Seborrheic keratosis located under the right breast, present for approximately seven years. It is benign and not causing any significant symptoms or concern.  - reassurance    Epidermal inclusion cyst of right chest  Epidermal inclusion cyst on the right chest, previously attempted removal by primary care doctor was unsuccessful. It is benign and not currently inflamed or painful.  - Surgical removal is an option if it becomes inflamed, but it would leave a scar. Recommend observation    RTC 2-4 months for shave removal R cheek lesion    Maryrose Larch, MD  01/02/2024

## 2024-01-06 ENCOUNTER — Ambulatory Visit (HOSPITAL_BASED_OUTPATIENT_CLINIC_OR_DEPARTMENT_OTHER)

## 2024-01-06 ENCOUNTER — Other Ambulatory Visit: Payer: Self-pay

## 2024-01-06 ENCOUNTER — Ambulatory Visit: Admitting: Internal Medicine

## 2024-01-06 DIAGNOSIS — M25562 Pain in left knee: Secondary | ICD-10-CM | POA: Diagnosis present

## 2024-01-06 DIAGNOSIS — S76312S Strain of muscle, fascia and tendon of the posterior muscle group at thigh level, left thigh, sequela: Secondary | ICD-10-CM

## 2024-01-06 NOTE — Progress Notes (Signed)
 Physical Therapy Note    S: Patient reports that HS stretch helped a little bit but pain still there    O: Refer to Rehabilitation Treatment Flowsheet    A: Today's session was focused on: STM to distal HS, hip strengthening exs, knee strengthening exs. Needs min to avoid knee flexion when performing rev clam. Min VC when performing HS curls to avoid pushing through pain. Min VC to take breaks in between. Encouraged hep compliance.    P: Progress to improve hip and knee strength, continue STM to relax distal HS      Arlena Sallie Chute, PT, Lic # [id number]

## 2024-01-06 NOTE — Progress Notes (Signed)
 01/06/24 1300   Language Information   Additional Comments L HS STRAIN   Precautions   Precautions General Precautions   Comments Elevated BP without diagnosis of hypertension  H/O L knee meniscal repair and L bunion sx     Other hyperlipidemia   Rehab Discipline   Rehab Discipline PT   Visit   Visit number 2   Recert Due Date  01/29/24   Time Calculation   Start Time 1300   Stop Time 1330   Time Calculation (min) 30 min   Manual Therapy 2   Technique STM: distal HS, calf in prone   Time in minutes 5 min   Ther Exercise   Exercise Bridge   Reps 10   Sets 3   Ther Exercise 2   Exercise S/L: CLAM RTB   Reps 2 10   Sets 2 2   Holds 2 EA   Ther Exercise 3   Exercise 3 S/L REVERSE CLAM   Reps 3 10   Sets 3 2   Holds 3 NO TB   Ther Exercise 4   Exercise 4 seated: HS curls RTB   Reps 4 10   Sets 4 3   Holds 4 L ONLY   Ther Exercise 5   Exercise 5 Squat to table   Reps 5 10   Sets 5 3   Holds 5 21''   Ther Exercise 6   Exercise 6 LP: #22   Reps 6 10   Sets 6 3   Patient Education   What was taught? [employer] CODE: [id number]   Method Verbal;Demo;Practice;Written   Patient comprehension Yes       Arlena Sallie Chute, PT, Lic # [id number]

## 2024-01-09 ENCOUNTER — Ambulatory Visit (HOSPITAL_BASED_OUTPATIENT_CLINIC_OR_DEPARTMENT_OTHER)

## 2024-01-13 ENCOUNTER — Ambulatory Visit
Admission: RE | Admit: 2024-01-13 | Discharge: 2024-01-13 | Disposition: A | Discharge status: Home / Self Care | Attending: Student in an Organized Health Care Education/Training Program | Admitting: Student in an Organized Health Care Education/Training Program

## 2024-01-13 ENCOUNTER — Other Ambulatory Visit: Payer: Self-pay

## 2024-01-13 DIAGNOSIS — E7849 Other hyperlipidemia: Secondary | ICD-10-CM

## 2024-01-13 DIAGNOSIS — I2510 Atherosclerotic heart disease of native coronary artery without angina pectoris: Secondary | ICD-10-CM | POA: Diagnosis present

## 2024-01-14 ENCOUNTER — Ambulatory Visit
Attending: Student in an Organized Health Care Education/Training Program | Admitting: Student in an Organized Health Care Education/Training Program

## 2024-01-14 ENCOUNTER — Encounter (HOSPITAL_BASED_OUTPATIENT_CLINIC_OR_DEPARTMENT_OTHER): Payer: Self-pay | Admitting: Student in an Organized Health Care Education/Training Program

## 2024-01-14 ENCOUNTER — Other Ambulatory Visit: Payer: Self-pay

## 2024-01-14 VITALS — BP 136/78 | HR 73 | Ht 68.0 in | Wt 165.0 lb

## 2024-01-14 DIAGNOSIS — F411 Generalized anxiety disorder: Secondary | ICD-10-CM | POA: Diagnosis not present

## 2024-01-14 DIAGNOSIS — D72810 Lymphocytopenia: Secondary | ICD-10-CM | POA: Diagnosis present

## 2024-01-14 DIAGNOSIS — R7303 Prediabetes: Secondary | ICD-10-CM | POA: Diagnosis present

## 2024-01-14 DIAGNOSIS — E7849 Other hyperlipidemia: Secondary | ICD-10-CM | POA: Diagnosis not present

## 2024-01-14 DIAGNOSIS — R911 Solitary pulmonary nodule: Secondary | ICD-10-CM | POA: Diagnosis present

## 2024-01-14 DIAGNOSIS — G4733 Obstructive sleep apnea (adult) (pediatric): Secondary | ICD-10-CM | POA: Diagnosis present

## 2024-01-14 DIAGNOSIS — F330 Major depressive disorder, recurrent, mild: Secondary | ICD-10-CM | POA: Insufficient documentation

## 2024-01-14 LAB — CBC, PLATELET & DIFFERENTIAL
ABSOLUTE BASO COUNT: 0.1 TH/uL (ref 0.0–0.1)
ABSOLUTE EOSINOPHIL COUNT: 0.2 TH/uL (ref 0.0–0.8)
ABSOLUTE IMM GRAN COUNT: 0.03 TH/uL (ref 0.00–0.10)
ABSOLUTE LYMPH COUNT: 1.9 TH/uL (ref 0.6–5.9)
ABSOLUTE MONO COUNT: 0.5 TH/uL (ref 0.2–1.4)
ABSOLUTE NEUTROPHIL COUNT: 5.2 TH/uL (ref 1.6–8.3)
ABSOLUTE NRBC COUNT: 0 TH/uL (ref 0.0–0.0)
BASOPHIL %: 0.6 % (ref 0.0–1.2)
EOSINOPHIL %: 2 % (ref 0.0–7.0)
HEMATOCRIT: 44.6 % (ref 34.1–44.9)
HEMOGLOBIN: 13.8 g/dL (ref 11.2–15.7)
IMMATURE GRANULOCYTE %: 0.4 % (ref 0.0–1.0)
LYMPHOCYTE %: 23.8 % (ref 15.0–54.0)
MEAN CORP HGB CONC: 30.9 g/dL — ABNORMAL LOW (ref 31.0–37.0)
MEAN CORPUSCULAR HGB: 27 pg (ref 26.0–34.0)
MEAN CORPUSCULAR VOL: 87.1 fl (ref 80.0–100.0)
MEAN PLATELET VOLUME: 11.7 fL (ref 8.7–12.5)
MONOCYTE %: 6.8 % (ref 4.0–13.0)
NEUTROPHIL %: 66.4 % (ref 40.0–75.0)
NRBC %: 0 % (ref 0.0–0.0)
PLATELET COUNT: 291 TH/uL (ref 150–400)
RBC DISTRIBUTION WIDTH STD DEV: 45.3 fL (ref 35.1–46.3)
RED BLOOD CELL COUNT: 5.12 M/uL (ref 3.90–5.20)
WHITE BLOOD CELL COUNT: 7.9 TH/uL (ref 4.0–11.0)

## 2024-01-14 LAB — HEPATIC FUNCTION PANEL
ALANINE AMINOTRANSFERASE: 27 U/L (ref 12–45)
ALBUMIN: 4.3 g/dL (ref 3.4–5.2)
ALKALINE PHOSPHATASE: 123 U/L — ABNORMAL HIGH (ref 45–117)
ASPARTATE AMINOTRANSFERASE: 21 U/L (ref 8–34)
BILIRUBIN DIRECT: 0.1 mg/dL (ref 0.0–0.2)
BILIRUBIN TOTAL: 0.3 mg/dL (ref 0.2–1.0)
INDIRECT BILIRUBIN: 0.1 mg/dL — ABNORMAL LOW (ref 0.2–0.9)
TOTAL PROTEIN: 6.5 g/dL (ref 6.4–8.2)

## 2024-01-14 LAB — HEMOGLOBIN A1C
ESTIMATED AVERAGE GLUCOSE: 111 mg/dL (ref 74–160)
HEMOGLOBIN A1C: 5.5 % (ref 4.0–5.6)

## 2024-01-14 LAB — LIPID PANEL
Cholesterol: 129 mg/dL (ref 0–239)
HIGH DENSITY LIPOPROTEIN: 66 mg/dL (ref 40–60)
LOW DENSITY LIPOPROTEIN DIRECT: 53 mg/dL (ref 0–189)
TRIGLYCERIDES: 131 mg/dL (ref 0–150)

## 2024-01-14 LAB — CREATINE KINASE TOTAL: CREATINE KINASE TOTAL: 222 U/L — ABNORMAL HIGH (ref 26–192)

## 2024-01-14 MED ORDER — BUPROPION HCL ER (XL) 150 MG PO TB24
150.0000 mg | ORAL_TABLET | Freq: Every morning | ORAL | 1 refills | Status: AC
Start: 2024-01-14 — End: 2024-07-12

## 2024-01-14 MED ORDER — ESCITALOPRAM OXALATE 10 MG PO TABS
10.0000 mg | ORAL_TABLET | Freq: Every day | ORAL | 1 refills | Status: AC
Start: 2024-01-14 — End: 2024-07-12

## 2024-01-14 NOTE — Progress Notes (Signed)
 PRIMARY CARE NOTE - FOLLOW-UP VISIT     SUBJECTIVE  Denise Gutierrez is a 68 year old English speaking female here for:    Chief concern(s): Follow-up    Today patient reports:    Still very stressed about her brother - he is undergoing treatment for bladder cancer  Pt feels she is suffering on his behalf  Trying to distract herself but having trouble - Scotti, her brother and sister are best friends  He is receiving infusions for bladder cancer - he is tolerating treatment, completes tx 11/7  Sister is undergoing treatment for thymoma     OSA  She is feeling significant improvement in her energy levels with CPAP therapy during sleep   Feels rested upon waking, sleeping better   Has enough energy to reorganize her apartment     Mood   Juggling a lot of stress  Overall feels mood is okay  -escitalopram  10 mg daily   -bupropion  150 mg daily     No falls recently   FALL RISK ASSESSMENT  Denise Gutierrez was asked the following Fall Risk Assessment questions on 01/14/2024 during her visit at [facility]:  Do you use any of the following assist devices: N/A  Have you fallen down recently? No  If yes, where did the fall(s) occur? N/A  Which hand do you consider your dominant hand? right    Still having some pain below left hip, radiating down left leg  Resolved this morning     HLD  Following with cardiology possibly familial HLD  Statin intolerance   Started Zetia  10 mg daily and also started Repatha    Tolerating these without SE   Making some dietary changes- cut out pork rinds, eating more oatmeal  Exercising more often     Incidental pulmonary nodule identified on coronary CT   Pt smoked a small amount of cigarettes in the past  Quit 20 yrs ago, smoked 3 cigarettes per day on average  Duration of smoking was 2 yrs       ROS - Pertinent symptoms as noted above, other ROS negative    Active Medical Issues:  Patient Active Problem List:     Elevated BP without diagnosis of hypertension     Snoring      Toenail deformity     Insomnia     Major depressive disorder, recurrent, mild (HCC)     GAD (generalized anxiety disorder)     Other hyperlipidemia     Right knee pain     Stiffness of hand joint     Neck pain     Left knee pain     Left hip pain     Left shoulder pain     Elevated hemoglobin A1c     Allergic rhinitis     OSA (obstructive sleep apnea)     Barrett's esophagus without dysplasia    Past medical, surgical, and social history reviewed. Allergies reviewed.     Medications reviewed:  Current Outpatient Medications   Medication Instructions    ascorbic acid (VITAMIN C) 500 mg, DAILY    buPROPion  (WELLBUTRIN  XL) 150 mg, Oral, EVERY MORNING    Cobalamin Combinations (B-12) [phone] MCG SUBL     escitalopram  (LEXAPRO ) 10 mg, Oral, DAILY    evolocumab  (REPATHA  SURECLICK) 140 mg, Subcutaneous, EVERY 14 DAYS    ezetimibe  (ZETIA ) 10 mg, Oral, DAILY    fluticasone  (FLONASE ) 50 MCG/ACT nasal spray SPRAY 1 SPRAY INTO [facility]  NOSTRIL IN THE MORNING    Multiple Vitamins-Minerals (THERA-M) TABS 1 tablet, DAILY    VITAMIN D  PO 600 mg, DAILY       OBJECTIVE  BP 136/78   Pulse 73   Ht 5' 8 (1.727 m)   Wt 74.8 kg (165 lb)   SpO2 98%   BMI 25.09 kg/m     Physical Exam  Gen: well appearing, pleasant, nad  Mmm, no scleral icterus  Breathing comfortably  Skin wwp  Gait normal   Appropriate mood and affect      I personally reviewed relevant labs, imaging, and studies in EpicCare.     ASSESSMENT AND PLAN  Denise Gutierrez is a 68 year old female who presents with:      1. Major depressive disorder, recurrent, mild (HCC)  2. GAD (generalized anxiety disorder)  - buPROPion  (WELLBUTRIN  XL) 150 MG 24 hr tablet; Take 1 tablet by mouth every morning  Dispense: 90 tablet; Refill: 1  - escitalopram  (LEXAPRO ) 10 MG tablet; Take 1 tablet by mouth daily  Dispense: 90 tablet; Refill: 1  Stable - mood is okay despite significant stress r/t family's health (brother and sister both have cancer)   Continue current Rx which pt is tolerating  without significant SE    3. Other hyperlipidemia  Appreciate cardiology recs  Tolerating Zetia  and Repatha  - check 4 week labs post treatment based on cardiology recommendation (reviewed note from 9/23)  - CREATINE KINASE TOTAL  - HEPATIC FUNCTION PANEL  - LIPID PANEL    4. OSA (obstructive sleep apnea)  Tolerating CPAP and noticing improved energy levels  Appreciate sleep clinic recs    5. Pulmonary nodule  Incidentally identified on CT cardiac calcium  score  Hx of being a light smoker, will order 1 year f/u surveillance CT - health maintenance updated (12/2024)    6. Lymphopenia  Noted incidentally with bloodwork earlier this year, repeat CBC today with other labs   - CBC, PLATELET & DIFFERENTIAL    7. Pre-diabetes  - HEMOGLOBIN A1C      Will get flu vaccine at [facility]     Follow up in approximately 3 mos. ER and return precautions reviewed.     I spent a total of 32 minutes on this visit on the date of service (total time includes all activities performed on the date of service)  ____________    Denise Greenspan, DO    *If medications were prescribed today, we discussed Denise Gutierrez's new and current medications, including risks, benefits, and potential side effects. The patient expressed understanding and no barriers to adherence were identified.  Any tests ordered today were discussed with the patient.  The patient expressed understanding with plan.    Time spent on the day of the visit on some or all of the following activities:   Preparing to see the patient (eg, review of tests)   Obtaining and/or reviewing separately obtained history   Performing a medically appropriate examination and/or evaluation   Counseling and educating the patient/family/caregiver   Ordering medications, tests, or procedures   Referring and communicating with other health care professionals    Documenting clinical information in the electronic or other health record   Independently interpreting results and  communicating results to the  patient/family/caregiver   Care coordination

## 2024-01-17 ENCOUNTER — Encounter (HOSPITAL_BASED_OUTPATIENT_CLINIC_OR_DEPARTMENT_OTHER): Payer: Self-pay | Admitting: Student in an Organized Health Care Education/Training Program

## 2024-01-17 ENCOUNTER — Ambulatory Visit (HOSPITAL_BASED_OUTPATIENT_CLINIC_OR_DEPARTMENT_OTHER)

## 2024-01-21 ENCOUNTER — Ambulatory Visit (HOSPITAL_BASED_OUTPATIENT_CLINIC_OR_DEPARTMENT_OTHER): Payer: Self-pay | Admitting: Student in an Organized Health Care Education/Training Program

## 2024-01-21 DIAGNOSIS — I728 Aneurysm of other specified arteries: Secondary | ICD-10-CM

## 2024-01-21 NOTE — Telephone Encounter (Signed)
 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     D/w cardiology will refer to vascular surgery

## 2024-01-22 ENCOUNTER — Ambulatory Visit

## 2024-01-22 ENCOUNTER — Other Ambulatory Visit: Payer: Self-pay

## 2024-01-22 DIAGNOSIS — S76312S Strain of muscle, fascia and tendon of the posterior muscle group at thigh level, left thigh, sequela: Secondary | ICD-10-CM

## 2024-01-22 DIAGNOSIS — M25562 Pain in left knee: Secondary | ICD-10-CM | POA: Diagnosis present

## 2024-01-22 NOTE — Progress Notes (Signed)
 Subjective:   Pt stated that her symptoms are worse at night during sleep hours/positions and upon waking. Her symptoms improve with mobility.    Objective:     01/22/24 1400   Precautions   Precautions General Precautions   Comments Elevated BP without diagnosis of hypertension  H/O L knee meniscal repair and L bunion sx     Other hyperlipidemia   Rehab Discipline   Rehab Discipline PT   Visit   Visit number 3   Recert Due Date  01/29/24   Time Calculation   Start Time 1400   Stop Time 1430   Time Calculation (min) 30 min   Manual Therapy 2   Technique IASTM (Thera-Stick) to L proximal HS in prone   Time in minutes 5   Manual Therapy 3   Technique MET's to correct a L ASIS anterior torsion   Time in minutes 2   Ther Exercise   Exercise Bridge with PPT   Reps 10   Sets 2   Ther Exercise 2   Exercise S/L: CLAM RTB   Reps 2 10   Sets 2 2   Ther Exercise 3   Exercise 3 S/L REVERSE CLAM   Reps 3 10   Sets 3 2   Ther Exercise 4   Exercise 4 seated: HS curls RTB   Sets 4 10   Holds 4 2   Ther Exercise 5   Exercise 5 Squat to table   Reps 5 10   Sets 5 2   Ther Exercise 7   Exercise 7 Prone hip ext   Reps 7 10   Sets 7 2   Holds 7 3#   Ther Exercise 8   Exercise 8 Prone HS curls   Reps 8 10   Sets 8 2   Holds 8 3# LLE   Patient Education   What was taught? Added manual therapy and HEP review   Method Verbal;Practice   Patient comprehension Yes     Assessment:  IASTM was used to increase local circulation and decrease muscle spasms/stiffness. Pt was advised that IASTM (Thera-Stick) could produce localized bruising to treatment area. Pt presented with a L ASIS anterior torsion that was corrected with MET's. Pt was able to tolerate addition of prone HS curls/prone hip ext with 3# ankle weights and HEP review without adverse reactions to LLE.    Plan:  Continue with HEP to improve her LLE strength, flexibility, gait and balance.      Marsa Nap, PTA, Lic # [id number]

## 2024-01-23 ENCOUNTER — Ambulatory Visit

## 2024-01-23 ENCOUNTER — Other Ambulatory Visit: Payer: Self-pay

## 2024-01-23 VITALS — BP 129/74 | HR 73 | Temp 98.0°F | Wt 144.4 lb

## 2024-01-23 DIAGNOSIS — I728 Aneurysm of other specified arteries: Secondary | ICD-10-CM | POA: Diagnosis present

## 2024-01-23 NOTE — Progress Notes (Signed)
 OUTPATIENT VASCULAR CONSULTATION  [facility]  Date of visit:    01/23/2024    HISTORY OF PRESENT ILLNESS    [name] is a 68 y/o female with incidental finding of splenic artery aneurysm.   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 She is without complaints. Nonsmoker, walks regularly, 10k steps per day. She is post menopausal.       Past Medical History  Past Medical History:  No date: Depression  No date: Elevated cholesterol  No date: HTN (hypertension)    Past Surgical History  Past Surgical History:  No date: APPENDECTOMY  No date: BREAST BIOPSY; Right      Comment:  neg.  No date: BREAST CYST EXCISION  No date: BREAST MASS EXCISION  No date: BUNIONECTOMY  No date: KNEE CARTILAGE SURGERY      Comment:  left  2000: LASIK  No date: RPR 1ST INGUN HRNA PRETERM INFT RDC      Comment:  as child  No date: VAGINAL HYSTERECTOMY UTERUS 250 GM/<      Comment:  did not remove cervix - due to fibroids    Medications  Cobalamin Combinations (B-12) [phone] MCG SUBL, , Disp: , Rfl:   buPROPion  (WELLBUTRIN  XL) 150 MG 24 hr tablet, Take 1 tablet by mouth every morning, Disp: 90 tablet, Rfl: 1  escitalopram  (LEXAPRO ) 10 MG tablet, Take 1 tablet by mouth daily, Disp: 90 tablet, Rfl: 1  fluticasone  (FLONASE ) 50 MCG/ACT nasal spray, SPRAY 1 SPRAY INTO EACH NOSTRIL IN THE MORNING, Disp: 48 mL, Rfl: 0  ezetimibe  (ZETIA ) 10 MG tablet, Take 1 tablet by mouth daily, Disp: 30 tablet, Rfl: 2  evolocumab  (REPATHA  SURECLICK) 140 MG/ML auto-injector, Inject 1 mL under the skin every 14 (fourteen) days, Disp: 2 mL, Rfl: 2  Multiple Vitamins-Minerals (THERA-M) TABS, Take 1 tablet by mouth in the morning., Disp: , Rfl:   ascorbic acid (VITAMIN C) 500 MG tablet, Take 500 mg by mouth in the morning., Disp: , Rfl:   VITAMIN D  PO, Take 600 mg by mouth daily, Disp: , Rfl:     No current facility-administered medications on file prior to visit.      Allergies  Review of  Patient's Allergies indicates:   Contrast dye [iopam*    Hives    Comment:MRI    SHx/Tobacco Use:   Social History    Tobacco Use      Smoking status: Never      Smokeless tobacco: Never  .    Family History:   Review of patient's family history indicates:  Problem: Heart Disease      Relation: Mother          Age of Onset: (Not Specified)          Comment: died at 58 from CVA?  Problem: Stroke      Relation: Mother          Age of Onset: (Not Specified)          Comment: 75  Problem: Cancer - Prostate      Relation: Father          Age of Onset: 93          Comment: 36 from prostate cancer  Problem: Heart Disease      Relation: Father          Age of Onset: (Not Specified)          Comment: angina  Problem: Cancer -  Lung      Relation: Sister          Age of Onset: (Not Specified)          Comment: smoker 71, was a 9/[employer] first responder  Problem: Cancer - Other      Relation: Sister          Age of Onset: (Not Specified)          Comment: leukmeia (CML) on Gleevec, thymus cancer  Problem: High Cholesterol      Relation: Sister          Age of Onset: (Not Specified)  Problem: Retinal Detachment      Relation: Sister          Age of Onset: (Not Specified)  Problem: Diabetes      Relation: Brother          Age of Onset: (Not Specified)  Problem: Heart      Relation: Brother          Age of Onset: (Not Specified)          Comment: cariomypathy died at 59  Problem: Diabetes      Relation: Brother          Age of Onset: (Not Specified)  Problem: High Cholesterol      Relation: Brother          Age of Onset: (Not Specified)  Problem: Cancer - Other      Relation: Brother          Age of Onset: (Not Specified)          Comment: bladder cancer  Problem: Retinal Detachment      Relation: Brother          Age of Onset: (Not Specified)  Problem: OTHER (oth)      Relation: Granddaughter          Age of Onset: (Not Specified)          Comment: Autism  Problem: Cancer - Colon      Relation: FamHxNeg          Age of Onset: (Not  Specified)  Problem: Cancer - Cervical      Relation: FamHxNeg          Age of Onset: (Not Specified)  Problem: Cancer - Breast      Relation: FamHxNeg          Age of Onset: (Not Specified)  Problem: Cancer - Ovarian      Relation: FamHxNeg          Age of Onset: (Not Specified)  Problem: Alcohol/Drug Abuse      Relation: FamHxNeg          Age of Onset: (Not Specified)      ROS:   As per HPI, remaining review of systems otherwise noncontributory.       PHYSICAL EXAM:    There were no vitals filed for this visit.    General: No apparent distress  Neuro: alert and oriented, no gross motor/sensory deficits  PSYCHIATRIC: Mood and affect are appropriate.   HEENT: No icterus, no pallor noted  NECK: No jugular venous distention, no carotid bruits  CHEST: nonlabored  HEART: Regular rate and rhythm  ABD: soft, ND, NT    EXTREMITIES:   Palpable bilateral radial and DP pulses      PERTINENT INVESTIGATIONS:    1. LABS:   Lab Results   Component Value Date    NA 140 07/23/2023  K 4.4 07/23/2023    CL 104 07/23/2023    CO2 22 07/23/2023    BUN 18 07/23/2023    CREAT 0.7 07/23/2023    GLUCOSER 111 07/23/2023     Lab Results   Component Value Date    LDL 53 01/14/2024    HDL 66 01/14/2024    TG 131 01/14/2024       VASCULAR LAB/RADIOLOGY: see above      ASSESSMENT & PLAN    68 year old year old female presenting with incidentally found, small splenic artery aneurysm.  DUS mesenteric to assess splenic artery aneurysm, tele visit to discuss results.     I have spent 20 minutes in both face to face and non-face to face activities with the patient on the day of the encounter. This included time spent in counseling and/or coordination of care regarding above issues and diagnosis, preparation to see the patient (review of tests and imaging), obtaining/reviewing separately obtained history, ordering medications, tests, procedures, referring and communicating with other health professionals, documenting clinical information in the  electronic or health records, independently interpreting results, communicating results to the patient/family/caregiver and coordinating care.       Electronically signed by: Krew Hortman, MD, 01/23/2024 11:25 AM

## 2024-01-25 ENCOUNTER — Encounter (HOSPITAL_BASED_OUTPATIENT_CLINIC_OR_DEPARTMENT_OTHER): Payer: Self-pay

## 2024-01-29 ENCOUNTER — Other Ambulatory Visit: Payer: Self-pay

## 2024-01-29 ENCOUNTER — Ambulatory Visit

## 2024-01-29 DIAGNOSIS — M25562 Pain in left knee: Secondary | ICD-10-CM | POA: Insufficient documentation

## 2024-01-29 DIAGNOSIS — S76312S Strain of muscle, fascia and tendon of the posterior muscle group at thigh level, left thigh, sequela: Secondary | ICD-10-CM | POA: Insufficient documentation

## 2024-01-31 NOTE — Progress Notes (Signed)
 01/29/24 1400   Precautions   Precautions General Precautions   Comments Elevated BP without diagnosis of hypertension  H/O L knee meniscal repair and L bunion sx     Other hyperlipidemia   Rehab Discipline   Rehab Discipline PT   Visit   Visit number 4   Recert Due Date  01/29/24   Time Calculation   Start Time 1400   Stop Time 1430   Time Calculation (min) 30 min   Ther Exercise   Exercise Bridge   Reps 10   Ther Exercise 2   Exercise heel bridge   Reps 2 10   Sets 2 2   Ther Exercise 3   Exercise 3 HS stretch   Reps 3 2   Sets 3 1   Holds 3 30 sec   Ther Exercise 4   Exercise 4 seated: HS curls GTB   Reps 4 10   Sets 4 2   Holds 4 EA   Ther Exercise 5   Exercise 5 RDL: #5   Reps 5 10   Sets 5 3   Ther Exercise 6   Exercise 6 Mini squat   Reps 6 10   Sets 6 3   Ther Exercise 8   Exercise 8 LP: #33 ECC   Reps 8 10   Sets 8 [address] [place], [place] # [id number]

## 2024-01-31 NOTE — Progress Notes (Signed)
 Physical Therapy Note    S: Patient reports that today she woke up with no pain    O: Refer to Rehabilitation Treatment Flowsheet    A: Today's session was focused on: progressing closed chain strengthening exs per tolerance. Pt needed min VC with MS to help with appropriate positioning of her knee and foot. Encouraged hep compliance.    P: Progress to improve HS strength      Arlena Sallie Chute, PT, Lic # [id number]

## 2024-02-05 ENCOUNTER — Other Ambulatory Visit: Payer: Self-pay

## 2024-02-05 ENCOUNTER — Ambulatory Visit

## 2024-02-05 DIAGNOSIS — S76312S Strain of muscle, fascia and tendon of the posterior muscle group at thigh level, left thigh, sequela: Secondary | ICD-10-CM

## 2024-02-05 DIAGNOSIS — M25562 Pain in left knee: Secondary | ICD-10-CM | POA: Diagnosis not present

## 2024-02-05 NOTE — Progress Notes (Signed)
 "Outpatient Physical Therapy Recert  [facility]: [facility]    Patient Name: Denise Gutierrez  DOB: 18-Dec-1955  Referring Provider: Dr Quinten Lenis, MD  Diagnosis: Hamstring muscle strain, left, sequela   ICD-10: S76.312S   Date of Onset: Acute on Chronic    Subjective:   - Pt says that she feels 85% better when compared to beginning of PT. Says that she would like to work on balance  - little pain when laying on the bed but because her bed is very high and she was also moving furnitres this week. Stair climbing: okay, no problems.    Objective:   Please Note: Only populated fields were assessed by provider, fields left blank were not assessed. * denotes pain with testing.  Posture: normal  Gait: decreased stride and step length, decreased knee ext with HS and push off  Tenderness to Palpation: increased TTP noted on HS posterior  Increased Tissue Density: L HS, slightly also on IT band  Joint Mobility: 3/6 R, 3/6 L    Knee Girth: No measurable swelling   Joint Line:  R,  L   2 inches Infrapatellar:  R,  L   2 inches Suprapatellar:  R,  L     RIGHT A/PROM LEFT A/PROM   HIP       FLEX       EXT       IR 40 30     ER 45 52   KNEE       FLEX 140 136/140     EXT -4 -4/-4      RIGHT STRENGTH LEFT STRENGTH   HIP       FLEX       EXT       ABD     KNEE       FLEX 5 4+     EXT 5 5   ANKLE       DF       PF       INV       EV       Balance: 0 SECONDS, increased ankle strategy.    Plan of Care  Referring Provider: Dr Quinten Lenis, MD  Diagnosis: Hamstring muscle strain, left, sequela   ICD-10: S76.312S     Assessment:  PT REEVAL DONE.  - Pt says that she feels 85% better when compared to beginning of PT. Says that she would like to work on balance  - little pain when laying on the bed but because her bed is very high and she was also moving furnitres this week. Stair climbing: okay, no problems.  Pt has made GOOD progress subjectively and objectively when compared to initial eval. She has good improvements in  functional mobility, strength, overall endurance. Pt is regular with HEP. On re assessment has improvements in objective measures. However, she continues to present with the following problems and impairments: impaired end range knee flexion, poor balance and increased ankle strategy to maintain overall stability.    Patient with benefit from skilled Physical Therapy focusing on improving current impairments to assist in return to prior level of function.    Co-morbidities were identified and taken into considerations of plan of care.    01/26/24  Short Term Goals: 4 weeks  Decrease L HS tightness by 50% and thereby improve mobility MET  Improve B/L Knee strength to 5/5 and improve gait PROGRESSING  Increased pt compliance with HEP MET  Enable patient  to negotiate 2 flights of stairs with less pain MET    Long Term Goals: 8 weeks  Decrease L HS tightness by 100% and thereby improve mobility PROGRESSING  Enable patient to walk >30 min with less pain PROGRESSING  3. Enable patient to negotiate 5 flights of stairs with less pain PROGRESSING  4. Improve overall balance w/ EO >15 sec on L PROGRESSING  5. Include closed and open chain ankle strengthening exs to improve overall stability and reduce load on the knee jt PROGRESSING    Plan:  ** PT Eval - Low Complexity (CPT 97161)  ** Stretching/ROM/Therapeutic Exercise (97110)  ** Neuromuscular Re-education (CPT [id number])  ** Manual Therapy / Joint / Soft tissue Mobilization (CPT 97140)  ** Electrical Stimulation/TENS (CPT 97014)  ** Hot/Cold Rx (CPT 97010)  ** Gait Training (CPT U2322610)  ** Functional Activities (CPT 97530)    Recommend skilled Physical Therapy services for 1 times per week for 8 weeks. Updated plan of care will be completed every 30 days.    The rehabilitation potential for this patient is good. Clinical presentation is stable and uncomplicated d/t pt's understanding, pt's willingness to be c/ w/ HEP, subjective and objective measures during  eval..      Patient [name] is aware of attendance policy: Yes  Plan of care discussed with Patient/Family: Yes  Patient goals reviewed and incorporated in plan of care: Yes  Patient/Family agrees with plan of care: Yes  Patient/Family education: Yes  Patient feels safe at home: Yes    Thank you for your referral. Please feel free to contact me with any questions or concerns.      Elisah Parmer Kavacheri Subramaniam, PT, Lic # [id number]      "

## 2024-02-06 NOTE — Progress Notes (Signed)
 I certify that the documented Treatment Plan is reasonable and necessary.    02/06/2024  Corean Greenspan, DO

## 2024-02-06 NOTE — Progress Notes (Signed)
 02/05/24 1400   Precautions   Precautions General Precautions   Comments Elevated BP without diagnosis of hypertension  H/O L knee meniscal repair and L bunion sx     Other hyperlipidemia   Rehab Discipline   Rehab Discipline PT   Visit   Visit number 5/recert   Recert Due Date  03/06/24   Time Calculation   Start Time 1400   Stop Time 1430   Time Calculation (min) 30 min   Ther Exercise   Exercise SLS with U/L 2 FINGER ASSIST   Reps 5   Sets 1   Holds 30 SEC   Ther Exercise 2   Exercise SIDE TO SIDE WB   Reps 2 25   Ther Exercise 3   Exercise 3 FRWD/BACWD   Reps 3 25   Patient Education   What was taught? Added manual therapy and HEP review   Method Verbal;Practice   Patient comprehension Yes       Arlena Sallie Chute, PT, Lic # [id number]

## 2024-02-11 ENCOUNTER — Encounter (HOSPITAL_BASED_OUTPATIENT_CLINIC_OR_DEPARTMENT_OTHER): Payer: Self-pay | Admitting: Student in an Organized Health Care Education/Training Program

## 2024-02-11 ENCOUNTER — Ambulatory Visit: Attending: Orthopaedic Surgery | Admitting: Orthopaedic Surgery

## 2024-02-11 ENCOUNTER — Other Ambulatory Visit: Payer: Self-pay

## 2024-02-11 DIAGNOSIS — M1712 Unilateral primary osteoarthritis, left knee: Secondary | ICD-10-CM | POA: Diagnosis present

## 2024-02-11 MED ORDER — TRIAMCINOLONE ACETONIDE 40 MG/ML IJ SUSP
40.0000 mg | Freq: Once | INTRAMUSCULAR | 0 refills | Status: AC
Start: 2024-02-11 — End: 2024-02-11

## 2024-02-11 MED ORDER — BUPIVACAINE HCL 0.5 % IJ SOLN
3.0000 mL | Freq: Once | INTRAMUSCULAR | 0 refills | Status: AC
Start: 2024-02-11 — End: 2024-02-11

## 2024-02-11 NOTE — Progress Notes (Signed)
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  Total time on date of encounter: 20 mins  I reviewed the medical record   I interviewed the patient  I examined the patient   I reviewed available data - images interpreted and reports reviewed  I discussed w PA  I performed the Medical Decision Making  I provided patient education  I documented in the EMR     Alm LELON Cater, MD, 02/11/2024    Patient gave verbal consent for cortisone injection. Sterile technique was used. 1cc of 40mg  of Kenalog and 5cc of 0.5% Marcaine was injected. Patient tolerated the procedure well.    PATIENT/PROCEDURE VERIFICATION DOCUMENTATION    Correct patient: Yes  Correct procedure: Yes  Correct side, site, mark visible if applicable: Yes  Correct position: Yes  Special equipment/implant(s) present, if applicable: Yes    Time-out completed, documented by provider doing procedure or designated team member

## 2024-02-11 NOTE — Progress Notes (Signed)
 Orthopedic Office Note    CC: Patient presents with:  Follow Up: L.knee      ORTHOPEDIC PROBLEM LIST:  1.  Bilateral knee pain, L>R  2.  Bilateral knee osteoarthritis, L>R  3. Hx bilateral knee arthroscopic meniscal procedures    HPI: Denise Gutierrez is a 68 year old female who is here today for evaluation of bilateral knee pain.    To review: she was last seen on 11/07/23 with around 1 month of atraumatic left knee pain. Her symptoms are localized around the posterior aspect of the knee.  Occasionally wake her from sleep. Has avoided taking any Tylenol or ibuprofen  as she does not like taking oral anti-inflammatories.  Denies any swelling in the knee. Reports that she had arthroscopic meniscus surgery over 10 years ago in the left knee. Denies any numbness or tingling distally. She was found to have OA but held off on a cortisone injection.    Today she is experiencing pain in bilateral knees. She thinks she had an arthroscopic procedure on the right knee in the past as well. No hx for injections in either knee. Right knee pain is medial; left is posterior. She has been doing PT for the left knee but thinks it might be making her worse. Symptoms in both knees are worse with WB activities including walking, prolonged standing.    IMAGING: x-ray of the left knee 3 views taken on 11/04/23 plus AP standing films taken in 2022 shows moderate medial compartment arthritis with joint space narrowing, sclerosis and osteophyte formation.     XR of the right knee 3 views plus AP standing films from 06/17/20 shows mild medial compartment joint space narrowing, sclerosis and osteophyte formation.    Imaging was reviewed and interpreted by myself and Dr. Quinten during this visit.     PHYSICAL EXAM:  GENERAL: Alert and oriented, in no acute distress  MOOD: Appropriate.   MUSCULOSKELETAL: Evaluation of the left knee reveals no gross bony deformities.  Overlying skin is warm, dry and intact. There is no erythema, edema, ecchymosis or  effusion.  Mildly TTP over the medial joint line and diffusely posteriorly. ROM 0-130 degrees.  Ligaments are stable to varus and valgus stress. LLE is neurovascularly intact distally.    Brief evaluation of the right knee reveals no gross bony deformities. Mildly TTP at the medial joint line. ROM: 0-135. Mild crepitus. NVI distally.    ASSESSMENT/PLAN: 68 year old female here today for follow up of bilateral knee pain, L>R. She has known underlying bilateral knee osteoarthritis.  Discussed treatment options the patient including physical therapy, anti-inflammatory medications, topical gels, cortisone injections, and ultimately a total knee replacement down the road for definitive treatment. She was previously hesitant to consider injection but is having pain with PT. Would like to consider a left knee injection today. Plan to FU in 3 months for re-evaluation. She will reach out in the interim if the right knee gets worse and she would like to consider an injection.    Procedure:  After obtaining verbal consent, the left knee was prepped at the lateral infrapatellar port with chlorhexidine. 1 cc of Kenalog mixed with 5 cc of 0.5% Marcaine was injected intra-articularly using a 23 gauge needle. Bandaid was applied, patient tolerated the procedure well.      We reviewed the likely diagnosis, the prognosis, and various treatment options in detail. Risks and benefits of treatment plan discussed. The patient's questions have been answered, and the patient understands agrees with  treatment plan.     This note was prepared using voice recognition software. Please disregard any transcription errors.    Dr. Quinten saw and examined the patient and formulated the assessment and plan.    Lavanda Fillers, PA-C, 02/11/2024    Nursing Communications:  None

## 2024-02-13 ENCOUNTER — Ambulatory Visit: Admission: RE | Admit: 2024-02-13 | Discharge: 2024-02-13 | Disposition: A | Discharge status: Home / Self Care

## 2024-02-13 ENCOUNTER — Other Ambulatory Visit: Payer: Self-pay

## 2024-02-13 DIAGNOSIS — I728 Aneurysm of other specified arteries: Secondary | ICD-10-CM | POA: Diagnosis not present

## 2024-02-17 ENCOUNTER — Ambulatory Visit (HOSPITAL_BASED_OUTPATIENT_CLINIC_OR_DEPARTMENT_OTHER)

## 2024-02-21 ENCOUNTER — Ambulatory Visit

## 2024-02-26 ENCOUNTER — Other Ambulatory Visit: Payer: Self-pay

## 2024-02-26 ENCOUNTER — Ambulatory Visit

## 2024-02-26 DIAGNOSIS — M25562 Pain in left knee: Secondary | ICD-10-CM | POA: Diagnosis present

## 2024-02-26 DIAGNOSIS — S76312S Strain of muscle, fascia and tendon of the posterior muscle group at thigh level, left thigh, sequela: Secondary | ICD-10-CM | POA: Insufficient documentation

## 2024-02-26 NOTE — Progress Notes (Signed)
 Physical Therapy Note    S: Patient reports that she got the cortisone shot last week    O: Refer to Rehabilitation Treatment Flowsheet    A: Today's session was focused on: strengthening for LE and balance exs and ended with bike for mobility. Pt required VC when performing squat to table to avoid excessive pressure on her knees and maintain tension on the elastic band. VC with SLS to keep QS activated.  Encouraged hep compliance.    P: Progress to improve LE strength and stability        Arlena Sallie Chute, PT, Lic # [id number]

## 2024-02-26 NOTE — Progress Notes (Signed)
 02/26/24 1500   Precautions   Precautions General Precautions   Comments Elevated BP without diagnosis of hypertension  H/O L knee meniscal repair and L bunion sx     Other hyperlipidemia   Rehab Discipline   Rehab Discipline PT   Visit   Visit number 6   Recert Due Date  03/06/24   Time Calculation   Start Time 1530   Stop Time 1600   Time Calculation (min) 30 min   Ther Exercise   Exercise HR B/L   Reps 25   Ther Exercise 2   Exercise HEEL BRIDGE RTB ABOVE KNEE   Reps 2 10   Sets 2 3   Ther Exercise 3   Exercise 3 S/L CLAM RTB   Reps 3 10   Sets 3 3   Holds 3 EA SIDE   Ther Exercise 4   Exercise 4 STS WITH RTB ABOVE KNEE   Reps 4 10   Sets 4 3   Ther Exercise 5   Exercise 5 SLS WITH 2 FINGER ASSIST   Reps 5 5   Sets 5 1   Holds 5 30 SEC   Ther Exercise 6   Exercise 6 BIKE 5 MIN   Patient Education   What was taught? MEDBRIDGE CODE: [id number]   Method Verbal;Demo;Practice;Written   Patient comprehension Yes       Arlena Sallie Chute, PT, Lic # [id number]

## 2024-02-27 ENCOUNTER — Ambulatory Visit

## 2024-03-03 ENCOUNTER — Ambulatory Visit (HOSPITAL_BASED_OUTPATIENT_CLINIC_OR_DEPARTMENT_OTHER)

## 2024-03-04 ENCOUNTER — Ambulatory Visit

## 2024-03-06 ENCOUNTER — Telehealth (HOSPITAL_BASED_OUTPATIENT_CLINIC_OR_DEPARTMENT_OTHER): Payer: Self-pay

## 2024-03-06 NOTE — Telephone Encounter (Signed)
 Pt called to inform that something unexpected happened in her fly and she has to cancel all her PT appts. She will call us  back to schedule further.    Anelly Samarin Kavacheri Subramaniam, PT, Lic # [id number]

## 2024-03-10 ENCOUNTER — Ambulatory Visit

## 2024-03-17 ENCOUNTER — Other Ambulatory Visit (HOSPITAL_BASED_OUTPATIENT_CLINIC_OR_DEPARTMENT_OTHER): Payer: Self-pay | Admitting: Internal Medicine

## 2024-03-17 NOTE — Telephone Encounter (Signed)
 PER who is calling: Patient (self), Denise Gutierrez is a 68 year old female has requested a refill of REPATHA  .    Last Office Visit: 01/14/24     Other Med Adult:  Most Recent BP Reading(s)  01/23/24 : 129/74        Cholesterol (mg/dL)   Date Value   89/78/7974 129     LOW DENSITY LIPOPROTEIN DIRECT (mg/dL)   Date Value   89/78/7974 53     HIGH DENSITY LIPOPROTEIN (mg/dL)   Date Value   89/78/7974 66     TRIGLYCERIDES (mg/dL)   Date Value   89/78/7974 131         THYROID  SCREEN TSH REFLEX FT4 (uIU/mL)   Date Value   07/03/2022 2.520         TSH (THYROID  STIM HORMONE) (uIU/mL)   Date Value   09/20/2021 2.940       HEMOGLOBIN A1C (%)   Date Value   01/14/2024 5.5       No results found for: POCA1C      INR (no units)   Date Value   09/20/2021 1.0   07/05/2021 1.0   03/13/2021 1.1       SODIUM (mmol/L)   Date Value   07/23/2023 140       POTASSIUM (mmol/L)   Date Value   07/23/2023 4.4           CREATININE (mg/dL)   Date Value   95/70/7974 0.7       Documented patient preferred pharmacies:    [facility], [place], [place] - [address]  Phone: [phone] Fax: [phone]

## 2024-03-20 ENCOUNTER — Other Ambulatory Visit (HOSPITAL_BASED_OUTPATIENT_CLINIC_OR_DEPARTMENT_OTHER): Payer: Self-pay | Admitting: Internal Medicine

## 2024-03-20 ENCOUNTER — Other Ambulatory Visit (HOSPITAL_BASED_OUTPATIENT_CLINIC_OR_DEPARTMENT_OTHER): Payer: Self-pay | Admitting: Student in an Organized Health Care Education/Training Program

## 2024-03-20 NOTE — Telephone Encounter (Signed)
 PER who is calling: Pharmacy, Sheva Mcdougle is a 68 year old female has requested a refill of       ezetimibe  (ZETIA ) .          Last Office Visit  : 12/17/2023 Alvera Drape, MD      Last Tele Visit:  Visit date not found      Last Physical Exam:  01/05/2020    There are no preventive care reminders to display for this patient.    Other Med Adult:  Most Recent BP Reading(s)  01/23/24 : 129/74        Cholesterol (mg/dL)   Date Value   89/78/7974 129     LOW DENSITY LIPOPROTEIN DIRECT (mg/dL)   Date Value   89/78/7974 53     HIGH DENSITY LIPOPROTEIN (mg/dL)   Date Value   89/78/7974 66     TRIGLYCERIDES (mg/dL)   Date Value   89/78/7974 131         THYROID  SCREEN TSH REFLEX FT4 (uIU/mL)   Date Value   07/03/2022 2.520         TSH (THYROID  STIM HORMONE) (uIU/mL)   Date Value   09/20/2021 2.940       HEMOGLOBIN A1C (%)   Date Value   01/14/2024 5.5       No results found for: POCA1C      INR (no units)   Date Value   09/20/2021 1.0   07/05/2021 1.0   03/13/2021 1.1       SODIUM (mmol/L)   Date Value   07/23/2023 140       POTASSIUM (mmol/L)   Date Value   07/23/2023 4.4           CREATININE (mg/dL)   Date Value   95/70/7974 0.7       Documented patient preferred pharmacies:    [facility], [place], [place] - [address]  Phone: [phone] Fax: [phone]

## 2024-03-20 NOTE — Telephone Encounter (Signed)
 PER who is calling: Pharmacy, Denise Gutierrez is a 68 year old female has requested a refill of       fluticasone  (FLONASE ) .              Last Office Visit  : 01/14/2024 Glendora Krabbe, DO      Last Tele Visit:  Visit date not found      Last Physical Exam:  01/05/2020    There are no preventive care reminders to display for this patient.    Other Med Adult:  Most Recent BP Reading(s)  01/23/24 : 129/74        Cholesterol (mg/dL)   Date Value   89/78/7974 129     LOW DENSITY LIPOPROTEIN DIRECT (mg/dL)   Date Value   89/78/7974 53     HIGH DENSITY LIPOPROTEIN (mg/dL)   Date Value   89/78/7974 66     TRIGLYCERIDES (mg/dL)   Date Value   89/78/7974 131         THYROID  SCREEN TSH REFLEX FT4 (uIU/mL)   Date Value   07/03/2022 2.520         TSH (THYROID  STIM HORMONE) (uIU/mL)   Date Value   09/20/2021 2.940       HEMOGLOBIN A1C (%)   Date Value   01/14/2024 5.5       No results found for: POCA1C      INR (no units)   Date Value   09/20/2021 1.0   07/05/2021 1.0   03/13/2021 1.1       SODIUM (mmol/L)   Date Value   07/23/2023 140       POTASSIUM (mmol/L)   Date Value   07/23/2023 4.4           CREATININE (mg/dL)   Date Value   95/70/7974 0.7       Documented patient preferred pharmacies:      [id number] - [place], [place] - [address]  Phone: [phone] Fax: [phone]

## 2024-03-23 ENCOUNTER — Encounter (HOSPITAL_BASED_OUTPATIENT_CLINIC_OR_DEPARTMENT_OTHER): Payer: Self-pay | Admitting: Internal Medicine

## 2024-04-14 ENCOUNTER — Ambulatory Visit
Attending: Student in an Organized Health Care Education/Training Program | Admitting: Student in an Organized Health Care Education/Training Program

## 2024-04-14 ENCOUNTER — Other Ambulatory Visit: Payer: Self-pay

## 2024-04-14 VITALS — BP 139/82 | HR 67 | Temp 97.6°F | Ht 68.0 in | Wt 164.0 lb

## 2024-04-14 DIAGNOSIS — R030 Elevated blood-pressure reading, without diagnosis of hypertension: Secondary | ICD-10-CM | POA: Diagnosis not present

## 2024-04-14 DIAGNOSIS — E7849 Other hyperlipidemia: Secondary | ICD-10-CM | POA: Diagnosis not present

## 2024-04-14 DIAGNOSIS — F411 Generalized anxiety disorder: Secondary | ICD-10-CM | POA: Insufficient documentation

## 2024-04-14 DIAGNOSIS — R6889 Other general symptoms and signs: Secondary | ICD-10-CM | POA: Insufficient documentation

## 2024-04-14 DIAGNOSIS — F330 Major depressive disorder, recurrent, mild: Secondary | ICD-10-CM | POA: Diagnosis not present

## 2024-04-14 LAB — RESPIRATORY PANEL BASIC OUTPT
INFLUENZA A: NEGATIVE
INFLUENZA B: NEGATIVE
RESPIRATORY SYNCYTIAL VIRUS: NEGATIVE
SARS-COV-2: NEGATIVE

## 2024-04-14 NOTE — Progress Notes (Unsigned)
 PRIMARY CARE NOTE - FOLLOW-UP VISIT     SUBJECTIVE  Denise Gutierrez is a 69 year old English speaking female here for:    Chief concern(s): Follow-up    Today patient reports:    Last primary care visit late October     MDD  Her two siblings with whom she is very close are both undergoing treatment for cancer   Her brother who is 71 has bladder cancer, undergoing treatment in this in [place]   He just had surgery today - for BPH and suprapubic catheter placement   He also has BPH and has had to self catheterize which is very painful and is leading to recurrent UTIs   Pt is crying every day   She is sleeping okay   Not staying in bed, still able to be productive during the day   Appetite is okay   No SI     OSA  She does note that she is sleeping okay - feels the CPAP is very helpful         04/10/2024     5:11 PM 02/27/2022    12:52 PM 07/14/2021     7:39 AM   PHQ-9 TOTAL SCORE   Doc FlowSheet Total Score 10  2  1         Patient-reported    Data saved with a previous flowsheet row definition     Also having cold symptoms x10 days   Sore throat, sinus pressure, non productive cough, subjective fever     Neck pain has recurred worse on the left side     Intermittent foot cramping - severe episode 2 nights ago, woke her up from sleep, has occurred in the sleep     ROS - Pertinent symptoms as noted above, other ROS negative    Active Medical Issues:  Patient Active Problem List:     Elevated BP without diagnosis of hypertension     Snoring     Toenail deformity     Insomnia     Major depressive disorder, recurrent, mild (HCC)     GAD (generalized anxiety disorder)     Other hyperlipidemia     Right knee pain     Stiffness of hand joint     Neck pain     Left knee pain     Left hip pain     Left shoulder pain     Elevated hemoglobin A1c     Allergic rhinitis     OSA (obstructive sleep apnea)     Barrett's esophagus without dysplasia     Pulmonary nodule 01/13/24    Past medical, surgical, and social history reviewed. Allergies  reviewed.     Medications reviewed:  Current Outpatient Medications   Medication Instructions    ascorbic acid (VITAMIN C) 500 mg, DAILY    buPROPion  (WELLBUTRIN  XL) 150 mg, Oral, EVERY MORNING    Cobalamin Combinations (B-12) [phone] MCG SUBL     escitalopram  (LEXAPRO ) 10 mg, Oral, DAILY    ezetimibe  (ZETIA ) 10 mg, Oral, DAILY    fluticasone  (FLONASE ) 50 MCG/ACT nasal spray SPRAY 1 SPRAY INTO EACH NOSTRIL IN THE MORNING    Multiple Vitamins-Minerals (THERA-M) TABS 1 tablet, DAILY    REPATHA  SURECLICK 140 MG/ML auto-injector Inject 1 ML UNDER THE SKIN every 14 (fourteen) days    VITAMIN D  PO 600 mg, DAILY     OBJECTIVE  BP 139/82 (Site: LA, Position: Sitting, Cuff Size: Reg)   Pulse 67  Temp 97.6 F (36.4 C) (Temporal)   Ht 5' 8 (1.727 m)   Wt 74.4 kg (164 lb)   SpO2 100%   BMI 24.94 kg/m     Physical Exam  Gen: well appearing, pleasant, nad  Mmm, no scleral icterus  Breathing comfortably lungs CTAB  Skin wwp  Gait normal   Appropriate mood and affect    I personally reviewed relevant labs, imaging, and studies in EpicCare.     ASSESSMENT AND PLAN  Denise Gutierrez is a 69 year old female who presents with:    1. Major depressive disorder, recurrent, mild (HCC)  2. GAD (generalized anxiety disorder)  - RFL TO ADULT OUTPT PSYCH (NEW) [facility]  Discussed at length related to stressors (sick family members)   Declined dose increase of ssri or bupropion  today - cont at current doses  No safety concerns  Interested in therapy - referred, will ask [facility] care partner to check in   Reviewed urgent psych resources available     3. Elevated BP without diagnosis of hypertension  A bit higher today likely due to not feeling well CTM     4. Other hyperlipidemia  LDL improved nicely with regimen - cont fu with cardiology     5. Flu-like symptoms  No indications for abx today, no ER indication, r/o flu, covid, and strep; CTM   - RESPIRATORY PANEL BASIC OUTPT  - THROAT CULTURE BETA STREP    Follow up in approximately 3 mos. ER and  return precautions reviewed.     I spent a total of 32 minutes on this visit on the date of service (total time includes all activities performed on the date of service)  ____________    Corean Greenspan, DO    *If medications were prescribed today, we discussed Thaila Shehadeh's new and current medications, including risks, benefits, and potential side effects. The patient expressed understanding and no barriers to adherence were identified.  Any tests ordered today were discussed with the patient.  The patient expressed understanding with plan.    Time spent on the day of the visit on some or all of the following activities:   Preparing to see the patient (eg, review of tests)   Obtaining and/or reviewing separately obtained history   Performing a medically appropriate examination and/or evaluation   Counseling and educating the patient/family/caregiver   Ordering medications, tests, or procedures   Referring and communicating with other health care professionals    Documenting clinical information in the electronic or other health record   Independently interpreting results and  communicating results to the patient/family/caregiver   Care coordination

## 2024-04-15 ENCOUNTER — Ambulatory Visit (HOSPITAL_BASED_OUTPATIENT_CLINIC_OR_DEPARTMENT_OTHER): Payer: Self-pay

## 2024-04-16 LAB — THROAT CULTURE BETA STREP

## 2024-04-23 ENCOUNTER — Telehealth (HOSPITAL_BASED_OUTPATIENT_CLINIC_OR_DEPARTMENT_OTHER): Payer: Self-pay | Admitting: Health Educator

## 2024-04-23 ENCOUNTER — Encounter (HOSPITAL_BASED_OUTPATIENT_CLINIC_OR_DEPARTMENT_OTHER): Payer: Self-pay | Admitting: Health Educator

## 2024-04-23 NOTE — Telephone Encounter (Signed)
 Outreach call per: Glendora Krabbe, DO  Reason for call/referral: Grief Check in  Pls check in with pt in 1-3 weeks referred for pcbhi therapy for grief (related to sick family members) in the setting of depression and anxiety, thank you   Patient not Reached:  -Left voicemail and asked pt to call [phone] and leave a message with best time/day for this team member to reach out.  -Patient has [facility] appt scheduled for 05/27/24    - [facility] will offer these resources when speaking to patient:    [employer] [facility] @ [phone]  [email or web address]    2. [facility] @ [phone]  [email or web address]    Harvin Satchel, Mental Health Care Partner, 04/23/2024,10:40 AM

## 2024-04-23 NOTE — Telephone Encounter (Signed)
 Care Partner Note    Type of encounter:  Initial Telephone Encounter    Patient was referred to care partner through:  Inbasket message from PCP or PA    Reason for encounter: Depression    Assessment:   CP reached out to patient per her call back  Pt answered call  CP introduced self and reason for call  Patient reports brother has been diagnosed with cancer and is not doing well  Sister also has Lukemia  Pt lost other sibling and mom  Dealing with grieving all the losses and what siblings are going throw right now  Pt reports has support from older sister (who is the rock of the family) and her son  Pt is grateful to have therapy schedule for March    Appears to be a Step 2 patient on the Stepped Model of Care    Patient Active Problem List:     Elevated BP without diagnosis of hypertension     Snoring     Toenail deformity     Insomnia     Major depressive disorder, recurrent, mild (HCC)     GAD (generalized anxiety disorder)     Other hyperlipidemia     Right knee pain     Stiffness of hand joint     Neck pain     Left knee pain     Left hip pain     Left shoulder pain     Elevated hemoglobin A1c     Allergic rhinitis     OSA (obstructive sleep apnea)     Barrett's esophagus without dysplasia     Pulmonary nodule 01/13/24      Medication: ezetimibe  (ZETIA ) 10 MG tablet, Take 1 tablet by mouth daily, Disp: 30 tablet, Rfl: 2  fluticasone  (FLONASE ) 50 MCG/ACT nasal spray, SPRAY 1 SPRAY INTO EACH NOSTRIL IN THE MORNING, Disp: 48 mL, Rfl: 0  REPATHA  SURECLICK 140 MG/ML auto-injector, Inject 1 ML UNDER THE SKIN every 14 (fourteen) days, Disp: 2 mL, Rfl: 2  Cobalamin Combinations (B-12) 256 146 8029 MCG SUBL, , Disp: , Rfl:   buPROPion  (WELLBUTRIN  XL) 150 MG 24 hr tablet, Take 1 tablet by mouth every morning, Disp: 90 tablet, Rfl: 1  escitalopram  (LEXAPRO ) 10 MG tablet, Take 1 tablet by mouth daily, Disp: 90 tablet, Rfl: 1  Multiple Vitamins-Minerals (THERA-M) TABS, Take 1 tablet by mouth in the morning., Disp: , Rfl:    ascorbic acid (VITAMIN C) 500 MG tablet, Take 500 mg by mouth in the morning., Disp: , Rfl:   VITAMIN D  PO, Take 600 mg by mouth daily, Disp: , Rfl:     No current facility-administered medications on file prior to visit.      Relevant screening scores:  AWQ:   PHQ-9:       04/10/2024     5:11 PM 02/27/2022    12:52 PM 07/14/2021     7:39 AM   PHQ-9 TOTAL SCORE   Doc FlowSheet Total Score 10  2  1         Patient-reported    Data saved with a previous flowsheet row definition     GAD-7:       06/21/2022     6:01 PM 02/27/2022    12:53 PM 07/14/2021     7:39 AM 11/24/2020     4:34 PM 06/02/2020     4:07 PM 01/05/2020    11:54 AM 11/24/2019    10:37 AM   GAD-7 Total   GAD-7  Score 6 7 3 10 1 7 13      Child/Adolescent:    How difficult have these problems made it for you to do your work, take care of things at home, or get along with people?  Very difficult     Engagement/motivation for change (Reference)    Preparation    Primary brief Intervention provided during this encounter:  Emotional Support and Teach and practice Mindfulness/Relaxation Exercise    -Discussed some relaxation/Mindfulness resources - sent via mychart    PLAN:   -Pt will start with therapy    TIME SPENT: 10-20 minutes     Harvin Satchel, 04/23/2024

## 2024-04-30 ENCOUNTER — Telehealth (HOSPITAL_BASED_OUTPATIENT_CLINIC_OR_DEPARTMENT_OTHER): Payer: Self-pay | Admitting: Health Educator

## 2024-04-30 NOTE — Telephone Encounter (Signed)
 Outreach call per: Corean Greenspan, DO  Reason for call/referral:  Depression/grief  Patient not Reached:  -Left voicemail and asked pt to call [phone] and leave a message with best time/day for this team member to reach out.  -Pt has appt with [facility] on 03/04    Harvin Satchel, Mental Health Care Partner, 04/30/2024,2:44 PM

## 2024-05-13 ENCOUNTER — Ambulatory Visit: Admitting: Orthopaedic Surgery

## 2024-05-18 ENCOUNTER — Ambulatory Visit: Admitting: Internal Medicine

## 2024-05-25 ENCOUNTER — Ambulatory Visit

## 2024-05-27 ENCOUNTER — Ambulatory Visit

## 2024-07-20 ENCOUNTER — Ambulatory Visit: Admitting: Student in an Organized Health Care Education/Training Program
